# Patient Record
Sex: Male | Born: 1960
Health system: Southern US, Community
[De-identification: ages and names within clinical notes are randomized; demographics above are authoritative.]

## PROBLEM LIST (undated history)

## (undated) DIAGNOSIS — M1990 Unspecified osteoarthritis, unspecified site: Secondary | ICD-10-CM

## (undated) DIAGNOSIS — Z8719 Personal history of other diseases of the digestive system: Secondary | ICD-10-CM

## (undated) DIAGNOSIS — T7840XA Allergy, unspecified, initial encounter: Secondary | ICD-10-CM

## (undated) HISTORY — DX: Unspecified osteoarthritis, unspecified site: M19.90

## (undated) HISTORY — DX: Allergy, unspecified, initial encounter: T78.40XA

## (undated) HISTORY — DX: Personal history of other diseases of the digestive system: Z87.19

---

## 1979-07-20 DIAGNOSIS — Z8719 Personal history of other diseases of the digestive system: Secondary | ICD-10-CM

## 1979-07-20 HISTORY — DX: Personal history of other diseases of the digestive system: Z87.19

## 1979-07-20 HISTORY — PX: ERCP: SHX5425

## 1987-07-20 HISTORY — PX: LUMBAR DISC SURGERY: SHX700

## 2016-01-07 ENCOUNTER — Telehealth: Payer: Self-pay

## 2016-01-07 NOTE — Telephone Encounter (Signed)
Pre vIsit call call completed.

## 2016-01-08 ENCOUNTER — Encounter: Payer: Self-pay | Admitting: Family

## 2016-01-08 ENCOUNTER — Ambulatory Visit (INDEPENDENT_AMBULATORY_CARE_PROVIDER_SITE_OTHER): Payer: 59 | Admitting: Family

## 2016-01-08 ENCOUNTER — Ambulatory Visit (HOSPITAL_BASED_OUTPATIENT_CLINIC_OR_DEPARTMENT_OTHER)
Admission: RE | Admit: 2016-01-08 | Discharge: 2016-01-08 | Disposition: A | Discharge status: Home / Self Care | Payer: 59 | Source: Ambulatory Visit | Attending: Family | Admitting: Family

## 2016-01-08 ENCOUNTER — Other Ambulatory Visit: Payer: Self-pay | Admitting: *Deleted

## 2016-01-08 VITALS — BP 100/72 | HR 68 | Temp 97.7°F | Resp 16 | Ht 66.0 in | Wt 117.2 lb

## 2016-01-08 DIAGNOSIS — M546 Pain in thoracic spine: Secondary | ICD-10-CM | POA: Insufficient documentation

## 2016-01-08 DIAGNOSIS — M47892 Other spondylosis, cervical region: Secondary | ICD-10-CM | POA: Diagnosis not present

## 2016-01-08 DIAGNOSIS — M4184 Other forms of scoliosis, thoracic region: Secondary | ICD-10-CM | POA: Diagnosis not present

## 2016-01-08 DIAGNOSIS — M47894 Other spondylosis, thoracic region: Secondary | ICD-10-CM | POA: Insufficient documentation

## 2016-01-08 DIAGNOSIS — Z720 Tobacco use: Secondary | ICD-10-CM | POA: Insufficient documentation

## 2016-01-08 DIAGNOSIS — M542 Cervicalgia: Secondary | ICD-10-CM

## 2016-01-08 DIAGNOSIS — N529 Male erectile dysfunction, unspecified: Secondary | ICD-10-CM | POA: Diagnosis not present

## 2016-01-08 MED ORDER — MELOXICAM 7.5 MG PO TABS: 7.5000 mg | ORAL_TABLET | Freq: Every day | ORAL | Status: DC

## 2016-01-08 MED ORDER — SILDENAFIL CITRATE 20 MG PO TABS: ORAL_TABLET | ORAL | Status: DC

## 2016-01-08 NOTE — Progress Notes (Signed)
Subjective:    Patient ID: Benjamin Johnson, male    DOB: [birth year], 55 y.o.   MRN: [id number]  HPI  Benjamin Johnson is a 55 yr old male who presents today to establish care.   Pmhx is significant for gallstones (s/p ERCP) 1981.  Still has GB.  Denies any issues since that time.   DDD (s/p lumbar disc surgery 1989 for "bulging disc") and tobacco abuse.    Tobacco abuse- Pt has been smoking for 30 years. Smokes 1 PPD.  The longest he quit for was 4 months.    R shoulder/arm pain- has been present x 1 month.  Started taking ibuprofen 800mg .  Has muscle tightness right neck and upper back.  Reports that pain is not as bad as it was bust  He states that he would like to gain weight.  Current BMI is 18.9.  Denies weight  ED- trouble maintaining erection x 1 year.     Review of Systems See HPI  Past Medical History  Diagnosis Date  . History of gallstones 1981     Social History   Social History  . Marital Status: Married    Spouse Name: N/A  . Number of Children: N/A  . Years of Education: N/A   Occupational History  . Not on file.   Social History Main Topics  . Smoking status: Current Every Day Smoker -- 30 years    Types: Cigarettes  . Smokeless tobacco: Never Used  . Alcohol Use: 3.6 oz/week    6 Cans of beer per week  . Drug Use: No  . Sexual Activity: Not on file   Other Topics Concern  . Not on file   Social History Narrative   Married since age 55   Twin girls (age 57) one is an Financial controllerflight attendant.  Grandson lives with them (age 744). One daughter in [place]   Son (age 57)  He has one son   Enjoys fishing, English as a second language teacherbowling   Works as an Probation officerupholsterer   Completed 12th grade          Past Surgical History  Procedure Laterality Date  . Ercp  1981  . Lumbar disc surgery  1989    Family History  Problem Relation Age of Onset  . Cancer Father 258    colon  . Hyperlipidemia Neg Hx   . Hypertension Neg Hx   . Diabetes Neg Hx     No Known Allergies  No current  outpatient prescriptions on file prior to visit.   No current facility-administered medications on file prior to visit.    BP 100/72 mmHg  Pulse 68  Temp(Src) 97.7 F (36.5 C) (Oral)  Resp 16  Ht 5\' 6"  (1.676 m)  Wt 117 lb 3.2 oz (53.162 kg)  BMI 18.93 kg/m2  SpO2 100%       Objective:   Physical Exam  Constitutional: He is oriented to person, place, and time. He appears well-developed and well-nourished. No distress.  HENT:  Head: Normocephalic and atraumatic.  Cardiovascular: Normal rate and regular rhythm.   No murmur heard. Pulmonary/Chest: Effort normal and breath sounds normal. No respiratory distress. He has no wheezes. He has no rales.  Musculoskeletal: He exhibits no edema.  Neurological: He is alert and oriented to person, place, and time.  Skin: Skin is warm and dry.  Psychiatric: He has a normal mood and affect. His behavior is normal. Thought content normal.          Assessment &  Plan:  Neck/back pain- will obtain x-rays of C-spine/T-spine, suspect some DDD.    Advised pt that his weight is actually in the normal "healthy" range.    40 minutes spent with pt today. >50% of this time was spent counseling patient on smoking cessation, healthy weight, neck/back pain and treatements.

## 2016-01-08 NOTE — Assessment & Plan Note (Signed)
Discussed risks/benefits of nicotine patch, chantix, zyban. He wishes to try nicotine patch. We will also refer him to the smoking cessation classes at [facility].

## 2016-01-08 NOTE — Patient Instructions (Addendum)
To help you quit smoking you may Start nicotine patch use 21 mg for 6 wks, 14 mg for 2 wks, 7 mg for 2 wks. 6-10 cigarettes per day, use 14 mg for 6 wks, 7 mg for 2 wks For ED you can fill Sildenafil (generic viagra) at [facility] [place] [phone] location is: [facility] [address] [place], [place] [phone]  For the neck/back pain- please complete your x rays on the first floor. Welcome to [facility]!

## 2016-01-08 NOTE — Assessment & Plan Note (Signed)
Trial of generic sildenafil 

## 2016-02-23 ENCOUNTER — Telehealth: Payer: Self-pay | Admitting: Family

## 2016-02-23 ENCOUNTER — Encounter: Payer: 59 | Admitting: Family

## 2016-02-23 DIAGNOSIS — Z0289 Encounter for other administrative examinations: Secondary | ICD-10-CM

## 2016-02-23 NOTE — Telephone Encounter (Signed)
pt says that his insurance is stating PCP is not in network. pt want to cancel appt until he knows for sure. Pt will call us back after speaking with his insurance company.   Pt would like to not be charged a No-Show fee

## 2016-02-23 NOTE — Telephone Encounter (Signed)
Noted, do not charge.  

## 2017-02-28 ENCOUNTER — Emergency Department (HOSPITAL_BASED_OUTPATIENT_CLINIC_OR_DEPARTMENT_OTHER)
Admission: EM | Admit: 2017-02-28 | Discharge: 2017-02-28 | Disposition: A | Discharge status: Home / Self Care | Payer: BLUE CROSS/BLUE SHIELD | Attending: Emergency Medicine | Admitting: Emergency Medicine

## 2017-02-28 ENCOUNTER — Encounter (HOSPITAL_BASED_OUTPATIENT_CLINIC_OR_DEPARTMENT_OTHER): Payer: Self-pay | Admitting: *Deleted

## 2017-02-28 ENCOUNTER — Emergency Department (HOSPITAL_BASED_OUTPATIENT_CLINIC_OR_DEPARTMENT_OTHER): Payer: BLUE CROSS/BLUE SHIELD

## 2017-02-28 DIAGNOSIS — Z79899 Other long term (current) drug therapy: Secondary | ICD-10-CM | POA: Diagnosis not present

## 2017-02-28 DIAGNOSIS — R1011 Right upper quadrant pain: Secondary | ICD-10-CM

## 2017-02-28 DIAGNOSIS — R1010 Upper abdominal pain, unspecified: Secondary | ICD-10-CM | POA: Diagnosis present

## 2017-02-28 DIAGNOSIS — F17210 Nicotine dependence, cigarettes, uncomplicated: Secondary | ICD-10-CM | POA: Diagnosis not present

## 2017-02-28 DIAGNOSIS — K2900 Acute gastritis without bleeding: Secondary | ICD-10-CM | POA: Insufficient documentation

## 2017-02-28 LAB — URINALYSIS, MICROSCOPIC (REFLEX): WBC, UA: NONE SEEN WBC/hpf (ref 0–5)

## 2017-02-28 LAB — COMPREHENSIVE METABOLIC PANEL
ALBUMIN: 3.8 g/dL (ref 3.5–5.0)
ALK PHOS: 76 U/L (ref 38–126)
ALT: 23 U/L (ref 17–63)
AST: 29 U/L (ref 15–41)
Anion gap: 9 (ref 5–15)
BUN: 8 mg/dL (ref 6–20)
CALCIUM: 9.4 mg/dL (ref 8.9–10.3)
CHLORIDE: 104 mmol/L (ref 101–111)
CO2: 25 mmol/L (ref 22–32)
CREATININE: 0.95 mg/dL (ref 0.61–1.24)
GFR calc Af Amer: >60 mL/min (ref >60)
GFR calc non Af Amer: >60 mL/min (ref >60)
GLUCOSE: 106 mg/dL — AB (ref 65–99)
Potassium: 3.7 mmol/L (ref 3.5–5.1)
SODIUM: 138 mmol/L (ref 135–145)
Total Bilirubin: 0.6 mg/dL (ref 0.3–1.2)
Total Protein: 7.1 g/dL (ref 6.5–8.1)

## 2017-02-28 LAB — URINALYSIS, ROUTINE W REFLEX MICROSCOPIC
Bilirubin Urine: NEGATIVE
Glucose, UA: NEGATIVE mg/dL
KETONES UR: NEGATIVE mg/dL
Leukocytes, UA: NEGATIVE
NITRITE: NEGATIVE
PROTEIN: NEGATIVE mg/dL
Specific Gravity, Urine: 1.006 (ref 1.005–1.030)
pH: 6 (ref 5.0–8.0)

## 2017-02-28 LAB — CBC
HCT: 41.4 % (ref 39.0–52.0)
HEMOGLOBIN: 14.3 g/dL (ref 13.0–17.0)
MCH: 31.2 pg (ref 26.0–34.0)
MCHC: 34.5 g/dL (ref 30.0–36.0)
MCV: 90.4 fL (ref 78.0–100.0)
PLATELETS: 240 10*3/uL (ref 150–400)
RBC: 4.58 MIL/uL (ref 4.22–5.81)
RDW: 13.3 % (ref 11.5–15.5)
WBC: 7.8 10*3/uL (ref 4.0–10.5)

## 2017-02-28 LAB — LIPASE, BLOOD: LIPASE: 18 U/L (ref 11–51)

## 2017-02-28 MED ORDER — ONDANSETRON HCL 4 MG PO TABS: 4.0000 mg | ORAL_TABLET | Freq: Four times a day (QID) | ORAL | 0 refills | Status: DC

## 2017-02-28 MED ORDER — MORPHINE SULFATE (PF) 4 MG/ML IV SOLN
4.0000 mg | Freq: Once | INTRAVENOUS | Status: AC
  Administered 2017-02-28: 4 mg via INTRAVENOUS
  Filled 2017-02-28: qty 1

## 2017-02-28 MED ORDER — TRAMADOL HCL 50 MG PO TABS: 100.0000 mg | ORAL_TABLET | Freq: Four times a day (QID) | ORAL | 0 refills | Status: DC | PRN

## 2017-02-28 MED ORDER — OMEPRAZOLE 20 MG PO CPDR: 20.0000 mg | DELAYED_RELEASE_CAPSULE | Freq: Every day | ORAL | 0 refills | Status: DC

## 2017-02-28 MED ORDER — IOPAMIDOL (ISOVUE-300) INJECTION 61%
100.0000 mL | Freq: Once | INTRAVENOUS | Status: AC | PRN
  Administered 2017-02-28: 100 mL via INTRAVENOUS

## 2017-02-28 MED ORDER — ONDANSETRON HCL 4 MG/2ML IJ SOLN
4.0000 mg | Freq: Once | INTRAMUSCULAR | Status: AC | PRN
  Administered 2017-02-28: 4 mg via INTRAVENOUS
  Filled 2017-02-28: qty 2

## 2017-02-28 MED ORDER — SODIUM CHLORIDE 0.9 % IV BOLUS (SEPSIS)
1000.0000 mL | Freq: Once | INTRAVENOUS | Status: AC
  Administered 2017-02-28: 1000 mL via INTRAVENOUS

## 2017-02-28 MED ORDER — SODIUM CHLORIDE 0.9 % IV SOLN: 1000.0000 mL | INTRAVENOUS | Status: DC

## 2017-02-28 MED FILL — traMADol HCL 50 MG TABS: 50 | 2 days supply | Qty: 15 | Fill #0

## 2017-02-28 MED FILL — ONDANSETRON HCL 4 MG TABLET: 4 | 3 days supply | Qty: 12 | Fill #0

## 2017-02-28 MED FILL — OMEPRAZOLE DR 20 MG CAPSULE: 20 | 30 days supply | Qty: 30 | Fill #0

## 2017-02-28 NOTE — ED Triage Notes (Signed)
Abdominal pain and lower back pain since yesterday.

## 2017-02-28 NOTE — ED Provider Notes (Signed)
[facility]   CSN: [id number] Arrival date & time: 02/28/17  1256     History   Chief Complaint Chief Complaint  Patient presents with  . Abdominal Pain    HPI Benjamin Johnson is a 56 y.o. male.  HPI Patient has abdominal pain that started fairly abruptly yesterday. He reports he is noticing most in his right upper and central abdomen and he indicates somewhat to the lower as well. Constant aching. He denies he's developed vomiting. He does have nausea. No diarrhea. He reports one bowel movement since onset of pain. He denies history of similar pain. He does report however he once had a gallbladder attack but those stones passed. That was a very long time ago. He reports he drinks about one beer nightly during the week and then a 6 pack on the weekend. Patient does smoke. He is otherwise healthy. Past Medical History:  Diagnosis Date  . History of gallstones 1981    Patient Active Problem List   Diagnosis Date Noted  . Tobacco abuse 01/08/2016  . ED (erectile dysfunction) 01/08/2016    Past Surgical History:  Procedure Laterality Date  . ERCP  1981  . LUMBAR DISC SURGERY  1989       Home Medications    Prior to Admission medications   Medication Sig Start Date End Date Taking? Authorizing Provider  meloxicam (MOBIC) 7.5 MG tablet Take 1 tablet (7.5 mg total) by mouth daily. 01/08/16   Sandford Craze'Sullivan, Melissa, NP  omeprazole (PRILOSEC) 20 MG capsule Take 1 capsule (20 mg total) by mouth daily. 02/28/17   Arby BarrettePfeiffer, Maame Dack, MD  ondansetron (ZOFRAN) 4 MG tablet Take 1 tablet (4 mg total) by mouth every 6 (six) hours. 02/28/17   Arby BarrettePfeiffer, Tavarious Freel, MD  sildenafil (REVATIO) 20 MG tablet Take 1-2 tablets by mouth prior to intercourse 01/08/16   Sandford Craze'Sullivan, Melissa, NP  traMADol (ULTRAM) 50 MG tablet Take 2 tablets (100 mg total) by mouth every 6 (six) hours as needed. 02/28/17   Arby BarrettePfeiffer, Jackalynn Art, MD    Family History Family History  Problem Relation Age of  Onset  . Cancer Father [id number]       colon  . Hyperlipidemia Neg Hx   . Hypertension Neg Hx   . Diabetes Neg Hx     Social History Social History  Substance Use Topics  . Smoking status: Current Every Day Smoker    Years: 30.00    Types: Cigarettes  . Smokeless tobacco: Never Used  . Alcohol use 3.6 oz/week    6 Cans of beer per week     Allergies   Patient has no known allergies.   Review of Systems Review of Systems 10 Systems reviewed and are negative for acute change except as noted in the HPI.   Physical Exam Updated Vital Signs BP 121/77   Pulse 76   Temp 98.6 F (37 C) (Oral)   Resp 20   Ht 5\' 5"  (1.651 m)   Wt 57.2 kg (126 lb)   SpO2 94%   BMI 20.97 kg/m   Physical Exam  Constitutional: He is oriented to person, place, and time. He appears well-developed and well-nourished.  HENT:  Head: Normocephalic and atraumatic.  Eyes: Conjunctivae are normal.  Neck: Neck supple.  Cardiovascular: Normal rate and regular rhythm.   No murmur heard. Pulmonary/Chest: Effort normal and breath sounds normal. No respiratory distress.  Abdominal: Soft. There is tenderness.  Patient was a tenderness in the right upper quadrant and  the right lower quadrant. No guarding.  Musculoskeletal: He exhibits no edema or tenderness.  Neurological: He is alert and oriented to person, place, and time. No cranial nerve deficit. He exhibits normal muscle tone. Coordination normal.  Skin: Skin is warm and dry.  Psychiatric: He has a normal mood and affect.  Nursing note and vitals reviewed.    ED Treatments / Results  Labs (all labs ordered are listed, but only abnormal results are displayed) Labs Reviewed  URINALYSIS, ROUTINE W REFLEX MICROSCOPIC - Abnormal; Notable for the following:       Result Value   Hgb urine dipstick TRACE (*)    All other components within normal limits  COMPREHENSIVE METABOLIC PANEL - Abnormal; Notable for the following:    Glucose, Bld 106 (*)    All  other components within normal limits  URINALYSIS, MICROSCOPIC (REFLEX) - Abnormal; Notable for the following:    Bacteria, UA RARE (*)    Squamous Epithelial / LPF 0-5 (*)    All other components within normal limits  LIPASE, BLOOD  CBC    EKG  EKG Interpretation None       Radiology Ct Abdomen Pelvis W Contrast  Result Date: 02/28/2017 CLINICAL DATA:  Right lower quadrant and right flank pain for 2 days. Low back pain. No known injury. EXAM: CT ABDOMEN AND PELVIS WITH CONTRAST TECHNIQUE: Multidetector CT imaging of the abdomen and pelvis was performed using the standard protocol following bolus administration of intravenous contrast. CONTRAST:  100 ml ISOVUE-300 IOPAMIDOL (ISOVUE-300) INJECTION 61% COMPARISON:  None. FINDINGS: Lower chest: Dependent atelectasis is seen in the lung bases. No pleural or pericardial effusion. Hepatobiliary: No focal liver abnormality is seen. No gallstones, gallbladder wall thickening, or biliary dilatation. Pancreas: Unremarkable. No pancreatic ductal dilatation or surrounding inflammatory changes. Spleen: Normal in size without focal abnormality. Adrenals/Urinary Tract: Right renal cyst is noted. The kidneys otherwise appear normal. Ureters are unremarkable. The urinary bladder is distended. The adrenal glands appear normal. Stomach/Bowel: Stomach is within normal limits. Appendix appears normal. No evidence of bowel wall thickening, distention, or inflammatory changes. Vascular/Lymphatic: No significant vascular findings are present. No enlarged abdominal or pelvic lymph nodes. Reproductive: The prostate gland is mildly enlarged. Other: No fluid collection. Musculoskeletal: Negative. IMPRESSION: No acute abnormality or finding to explain the patient's symptoms. Prostatomegaly. Electronically Signed   By: Drusilla Kanner M.D.   On: 02/28/2017 16:06    Procedures Procedures (including critical care time)  Medications Ordered in ED Medications  sodium  chloride 0.9 % bolus 1,000 mL (0 mLs Intravenous Stopped 02/28/17 1619)    Followed by  0.9 %  sodium chloride infusion (not administered)  ondansetron (ZOFRAN) injection 4 mg (4 mg Intravenous Given 02/28/17 1337)  morphine 4 MG/ML injection 4 mg (4 mg Intravenous Given 02/28/17 1419)  iopamidol (ISOVUE-300) 61 % injection 100 mL (100 mLs Intravenous Contrast Given 02/28/17 1543)     Initial Impression / Assessment and Plan / ED Course  I have reviewed the triage vital signs and the nursing notes.  Pertinent labs & imaging results that were available during my care of the patient were reviewed by me and considered in my medical decision making (see chart for details).      Final Clinical Impressions(s) / ED Diagnoses   Final diagnoses:  Right upper quadrant abdominal pain  Acute gastritis without hemorrhage, unspecified gastritis type   CT does not show acute anomaly. LFTs and lipase within normal limits. Patient does have symptoms of possible  gastritis. He does have regular alcohol use although reportedly not excessive. He also does smoke. Patient is counseled on starting Prilosec and dietary precautions are given for reflux\gastritis. He is advised on a follow-up plan. He reports he will schedule to be seen by a PCP. Return precautions are reviewed. New Prescriptions New Prescriptions   OMEPRAZOLE (PRILOSEC) 20 MG CAPSULE    Take 1 capsule (20 mg total) by mouth daily.   ONDANSETRON (ZOFRAN) 4 MG TABLET    Take 1 tablet (4 mg total) by mouth every 6 (six) hours.   TRAMADOL (ULTRAM) 50 MG TABLET    Take 2 tablets (100 mg total) by mouth every 6 (six) hours as needed.     Arby Barrette, MD 02/28/17 [phone]

## 2018-09-11 ENCOUNTER — Encounter (HOSPITAL_BASED_OUTPATIENT_CLINIC_OR_DEPARTMENT_OTHER): Payer: Self-pay | Admitting: *Deleted

## 2018-09-11 ENCOUNTER — Other Ambulatory Visit: Payer: Self-pay

## 2018-09-11 ENCOUNTER — Emergency Department (HOSPITAL_BASED_OUTPATIENT_CLINIC_OR_DEPARTMENT_OTHER)
Admission: EM | Admit: 2018-09-11 | Discharge: 2018-09-11 | Disposition: A | Discharge status: Home / Self Care | Payer: Self-pay | Attending: Emergency Medicine | Admitting: Emergency Medicine

## 2018-09-11 ENCOUNTER — Emergency Department (HOSPITAL_BASED_OUTPATIENT_CLINIC_OR_DEPARTMENT_OTHER): Payer: Self-pay

## 2018-09-11 DIAGNOSIS — R059 Cough, unspecified: Secondary | ICD-10-CM

## 2018-09-11 DIAGNOSIS — F17210 Nicotine dependence, cigarettes, uncomplicated: Secondary | ICD-10-CM | POA: Insufficient documentation

## 2018-09-11 DIAGNOSIS — J111 Influenza due to unidentified influenza virus with other respiratory manifestations: Secondary | ICD-10-CM | POA: Insufficient documentation

## 2018-09-11 DIAGNOSIS — R05 Cough: Secondary | ICD-10-CM

## 2018-09-11 MED ORDER — GUAIFENESIN-CODEINE 100-10 MG/5ML PO SOLN
5.0000 mL | Freq: Once | ORAL | Status: DC
  Filled 2018-09-11 (×2): qty 5

## 2018-09-11 MED ORDER — IBUPROFEN 800 MG PO TABS
800.0000 mg | ORAL_TABLET | Freq: Once | ORAL | Status: AC
  Administered 2018-09-11: 800 mg via ORAL
  Filled 2018-09-11: qty 1

## 2018-09-11 MED ORDER — PSEUDOEPH-BROMPHEN-DM 30-2-10 MG/5ML PO SYRP: 5.0000 mL | ORAL_SOLUTION | Freq: Four times a day (QID) | ORAL | 0 refills | Status: DC | PRN

## 2018-09-11 NOTE — ED Notes (Signed)
ED Provider at bedside. 

## 2018-09-11 NOTE — ED Triage Notes (Addendum)
Cough and fever since last night. States he feels like he has the flu. He took Ibuprofen 5 hours ago.

## 2018-09-11 NOTE — ED Provider Notes (Signed)
[facility] EMERGENCY DEPARTMENT Provider Note   CSN: [id number] Arrival date & time: 09/11/18  1750    History   Chief Complaint Chief Complaint  Patient presents with  . Cough    HPI Benjamin Johnson is a 58 y.o. male.     Patient is a 74 year old African-American male, smoker who presents emergency department for flulike symptoms since last night.  Reports cough, fever, body aches, nasal congestion since last night.  He did take some antipyretic about 5 hours ago.  Reports 1 episode of nonbloody vomiting.  Denies any abdominal pain, chest pain, shortness of breath.  Reports sick contacts.     Past Medical History:  Diagnosis Date  . History of gallstones 1981    Patient Active Problem List   Diagnosis Date Noted  . Tobacco abuse 01/08/2016  . ED (erectile dysfunction) 01/08/2016    Past Surgical History:  Procedure Laterality Date  . ERCP  1981  . LUMBAR DISC SURGERY  1989        Home Medications    Prior to Admission medications   Medication Sig Start Date End Date Taking? Authorizing Provider  brompheniramine-pseudoephedrine-DM 30-2-10 MG/5ML syrup Take 5 mLs by mouth 4 (four) times daily as needed. 09/11/18   Arlyn Dunning, PA-C  meloxicam (MOBIC) 7.5 MG tablet Take 1 tablet (7.5 mg total) by mouth daily. 01/08/16   Sandford Craze, NP  omeprazole (PRILOSEC) 20 MG capsule Take 1 capsule (20 mg total) by mouth daily. 02/28/17   Tegeler, Canary Brim, MD  ondansetron (ZOFRAN) 4 MG tablet Take 1 tablet (4 mg total) by mouth every 6 (six) hours. 02/28/17   Tegeler, Canary Brim, MD  sildenafil (REVATIO) 20 MG tablet Take 1-2 tablets by mouth prior to intercourse 01/08/16   Sandford Craze, NP  traMADol (ULTRAM) 50 MG tablet Take 2 tablets (100 mg total) by mouth every 6 (six) hours as needed. 02/28/17   Tegeler, Canary Brim, MD    Family History Family History  Problem Relation Age of Onset  . Cancer Father 28       colon  .  Hyperlipidemia Neg Hx   . Hypertension Neg Hx   . Diabetes Neg Hx     Social History Social History   Tobacco Use  . Smoking status: Current Every Day Smoker    Years: 30.00    Types: Cigarettes  . Smokeless tobacco: Never Used  Substance Use Topics  . Alcohol use: Yes    Alcohol/week: 6.0 standard drinks    Types: 6 Cans of beer per week  . Drug use: No     Allergies   Patient has no known allergies.   Review of Systems Review of Systems  Constitutional: Negative for chills and fever.  HENT: Positive for congestion. Negative for ear pain, sinus pressure, sinus pain and sore throat.   Eyes: Negative for pain and visual disturbance.  Respiratory: Positive for cough. Negative for shortness of breath.   Cardiovascular: Negative for chest pain and palpitations.  Gastrointestinal: Positive for nausea and vomiting. Negative for abdominal pain and diarrhea.  Genitourinary: Negative for dysuria and hematuria.  Musculoskeletal: Positive for myalgias. Negative for arthralgias and back pain.  Skin: Negative for color change and rash.  Allergic/Immunologic: Negative for immunocompromised state.  Neurological: Negative for dizziness, seizures and syncope.  All other systems reviewed and are negative.    Physical Exam Updated Vital Signs BP 107/60   Pulse (!) 108   Temp (!) 100.8 F (38.2 C) (  Oral)   Resp 20   Ht  (1.676 m)   Wt 61.7 kg   SpO2 96%   BMI 21.95 kg/m   Physical Exam Vitals signs and nursing note reviewed.  Constitutional:      Appearance: He is well-developed.  HENT:     Head: Normocephalic and atraumatic.     Right Ear: Tympanic membrane normal.     Left Ear: Tympanic membrane normal.     Nose: Congestion present. No rhinorrhea.     Mouth/Throat:     Mouth: Mucous membranes are moist.     Pharynx: Oropharynx is clear. No oropharyngeal exudate or posterior oropharyngeal erythema.  Eyes:     Conjunctiva/sclera: Conjunctivae normal.  Neck:      Musculoskeletal: Neck supple.  Cardiovascular:     Rate and Rhythm: Normal rate and regular rhythm.     Heart sounds: No murmur.  Pulmonary:     Effort: Pulmonary effort is normal. No respiratory distress.     Breath sounds: Normal breath sounds.  Abdominal:     Palpations: Abdomen is soft.     Tenderness: There is no abdominal tenderness.  Skin:    General: Skin is warm and dry.     Capillary Refill: Capillary refill takes less than 2 seconds.  Neurological:     General: No focal deficit present.     Mental Status: He is alert.  Psychiatric:        Mood and Affect: Mood normal.      ED Treatments / Results  Labs (all labs ordered are listed, but only abnormal results are displayed) Labs Reviewed - No data to display  EKG None  Radiology Dg Chest 2 View  Result Date: 09/11/2018 CLINICAL DATA:  Cough and fever EXAM: CHEST - 2 VIEW COMPARISON:  01/08/2016 FINDINGS: The heart size and mediastinal contours are within normal limits. Both lungs are clear. The visualized skeletal structures are unremarkable. IMPRESSION: No active cardiopulmonary disease. Electronically Signed   By: Judie Petit.  Shick M.D.   On: 09/11/2018 18:44    Procedures Procedures (including critical care time)  Medications Ordered in ED Medications  guaiFENesin-codeine 100-10 MG/5ML solution 5 mL (5 mLs Oral Not Given 09/11/18 1839)  ibuprofen (ADVIL,MOTRIN) tablet 800 mg (800 mg Oral Given 09/11/18 1824)     Initial Impression / Assessment and Plan / ED Course  I have reviewed the triage vital signs and the nursing notes.  Pertinent labs & imaging results that were available during my care of the patient were reviewed by me and considered in my medical decision making (see chart for details).        Based on review of vitals, medical screening exam, lab work and/or imaging, there does not appear to be an acute, emergent etiology for the patient's symptoms. Counseled pt on good return precautions and  encouraged both PCP and ED follow-up as needed.  Prior to discharge, I also discussed incidental imaging findings with patient in detail and advised appropriate, recommended follow-up in detail.  Clinical Impression: 1. Influenza   2. Cough     Disposition: Discharge    This note was prepared with assistance of Dragon voice recognition software. Occasional wrong-word or sound-a-like substitutions may have occurred due to the inherent limitations of voice recognition software.   Final Clinical Impressions(s) / ED Diagnoses   Final diagnoses:  Influenza  Cough    ED Discharge Orders         Ordered    brompheniramine-pseudoephedrine-DM 30-2-10 MG/5ML  syrup  4 times daily PRN     09/11/18 1854           Jeral Pinch 09/11/18 1856    Maia Plan, MD 09/12/18 1018

## 2018-09-11 NOTE — ED Notes (Signed)
Patient transported to X-ray 

## 2018-09-11 NOTE — Discharge Instructions (Signed)
Thank you for allowing me to care for you today. Please return to the emergency department if you have new or worsening symptoms. Take your medications as instructed.  ° °

## 2019-11-06 ENCOUNTER — Emergency Department (HOSPITAL_BASED_OUTPATIENT_CLINIC_OR_DEPARTMENT_OTHER): Payer: BLUE CROSS/BLUE SHIELD

## 2019-11-06 ENCOUNTER — Other Ambulatory Visit: Payer: Self-pay

## 2019-11-06 ENCOUNTER — Emergency Department (HOSPITAL_BASED_OUTPATIENT_CLINIC_OR_DEPARTMENT_OTHER)
Admission: EM | Admit: 2019-11-06 | Discharge: 2019-11-06 | Disposition: A | Discharge status: Home / Self Care | Payer: Worker's Compensation | Attending: Emergency Medicine | Admitting: Emergency Medicine

## 2019-11-06 ENCOUNTER — Encounter (HOSPITAL_BASED_OUTPATIENT_CLINIC_OR_DEPARTMENT_OTHER): Payer: Self-pay | Admitting: Oncology

## 2019-11-06 DIAGNOSIS — M25562 Pain in left knee: Secondary | ICD-10-CM | POA: Diagnosis present

## 2019-11-06 DIAGNOSIS — Z79899 Other long term (current) drug therapy: Secondary | ICD-10-CM | POA: Insufficient documentation

## 2019-11-06 DIAGNOSIS — F17210 Nicotine dependence, cigarettes, uncomplicated: Secondary | ICD-10-CM | POA: Insufficient documentation

## 2019-11-06 MED ORDER — NAPROXEN 500 MG PO TABS: 500.0000 mg | ORAL_TABLET | Freq: Two times a day (BID) | ORAL | 0 refills | Status: DC

## 2019-11-06 MED ORDER — HYDROCODONE-ACETAMINOPHEN 5-325 MG PO TABS
1.0000 | ORAL_TABLET | Freq: Once | ORAL | Status: AC
Start: 2019-11-06 — End: 2019-11-06
  Administered 2019-11-06: 1 via ORAL
  Filled 2019-11-06: qty 1

## 2019-11-06 NOTE — ED Notes (Signed)
UDS completed. Unable to complete a BAT due to having no one certified on it.

## 2019-11-06 NOTE — ED Triage Notes (Signed)
Pt reports slipping and falling at work onto his left knee.  States he needed assistance to get up.  Pt is also c/o lower back pain.  Pt reported that it felt like his left, "Twisted" Pt also heard a popping sound.

## 2019-11-06 NOTE — ED Notes (Signed)
ED Provider at bedside. 

## 2019-11-06 NOTE — ED Provider Notes (Addendum)
[facility] EMERGENCY DEPARTMENT Provider Note   CSN: [id number] Arrival date & time: 11/06/19  4097     History Chief Complaint  Patient presents with  . Knee Pain    Benjamin Johnson is a 59 y.o. male.  HPI     This is a 59 year old male with no reported significant past medical history who presents with left knee pain.  Patient reports that he was pushing carts at work when he fell.  His knee twisted and he felt a pop and that he fell to the floor.  He did not hit his head or lose consciousness.  He was given ibuprofen at work.  He rates his pain a 10 out of 10.  Pain is worse with range of motion and bearing weight.  Denies numbness or tingling of the leg.  Past Medical History:  Diagnosis Date  . History of gallstones 1981    Patient Active Problem List   Diagnosis Date Noted  . Tobacco abuse 01/08/2016  . ED (erectile dysfunction) 01/08/2016    Past Surgical History:  Procedure Laterality Date  . ERCP  1981  . LUMBAR DISC SURGERY  1989       Family History  Problem Relation Age of Onset  . Cancer Father 44       colon  . Hyperlipidemia Neg Hx   . Hypertension Neg Hx   . Diabetes Neg Hx     Social History   Tobacco Use  . Smoking status: Current Every Day Smoker    Years: 30.00    Types: Cigarettes  . Smokeless tobacco: Never Used  Substance Use Topics  . Alcohol use: Yes    Alcohol/week: 6.0 standard drinks    Types: 6 Cans of beer per week  . Drug use: No    Home Medications Prior to Admission medications   Medication Sig Start Date End Date Taking? Authorizing Provider  brompheniramine-pseudoephedrine-DM 30-2-10 MG/5ML syrup Take 5 mLs by mouth 4 (four) times daily as needed. 09/11/18   Arlyn Dunning, PA-C  meloxicam (MOBIC) 7.5 MG tablet Take 1 tablet (7.5 mg total) by mouth daily. 01/08/16   Sandford Craze, NP  naproxen (NAPROSYN) 500 MG tablet Take 1 tablet (500 mg total) by mouth 2 (two) times daily. 11/06/19   Rudell Ortman,  Mayer Masker, MD  omeprazole (PRILOSEC) 20 MG capsule Take 1 capsule (20 mg total) by mouth daily. 02/28/17   Tegeler, Canary Brim, MD  ondansetron (ZOFRAN) 4 MG tablet Take 1 tablet (4 mg total) by mouth every 6 (six) hours. 02/28/17   Tegeler, Canary Brim, MD  sildenafil (REVATIO) 20 MG tablet Take 1-2 tablets by mouth prior to intercourse 01/08/16   Sandford Craze, NP  traMADol (ULTRAM) 50 MG tablet Take 2 tablets (100 mg total) by mouth every 6 (six) hours as needed. 02/28/17   Tegeler, Canary Brim, MD    Allergies    Patient has no known allergies.  Review of Systems   Review of Systems  Constitutional: Negative for fever.  Musculoskeletal:       Left knee pain  Neurological: Negative for weakness and numbness.  All other systems reviewed and are negative.   Physical Exam Updated Vital Signs BP 129/84 (BP Location: Right Arm)   Pulse 63   Temp 97.8 F (36.6 C) (Oral)   Resp 16   Ht 1.664 m (5' 5.5")   Wt 58.5 kg   SpO2 100%   BMI 21.14 kg/m   Physical Exam Vitals and  nursing note reviewed.  Constitutional:      Appearance: He is well-developed. He is not ill-appearing.  HENT:     Head: Normocephalic and atraumatic.     Nose: Nose normal.     Mouth/Throat:     Mouth: Mucous membranes are moist.  Eyes:     Pupils: Pupils are equal, round, and reactive to light.  Cardiovascular:     Rate and Rhythm: Normal rate and regular rhythm.  Pulmonary:     Effort: Pulmonary effort is normal. No respiratory distress.  Musculoskeletal:     Cervical back: Neck supple.     Comments: Tenderness palpation of the medial aspect of the knee without obvious effusion or deformity, there are no overlying skin changes, limited range of motion secondary to pain, no joint laxity noted, patient able to fire her quad, 2+ DP pulse, neurovascular intact distally  Lymphadenopathy:     Cervical: No cervical adenopathy.  Skin:    General: Skin is warm and dry.  Neurological:     Mental  Status: He is alert and oriented to person, place, and time.  Psychiatric:        Mood and Affect: Mood normal.     ED Results / Procedures / Treatments   Labs (all labs ordered are listed, but only abnormal results are displayed) Labs Reviewed - No data to display  EKG None  Radiology DG Knee Complete 4 Views Left  Result Date: 11/06/2019 CLINICAL DATA:  Left knee pain. EXAM: LEFT KNEE - COMPLETE 4+ VIEW COMPARISON:  No prior. FINDINGS: No acute bony or joint abnormality identified. No evidence of fracture or dislocation. Tiny loose body in the femoral tibial joint space cannot be excluded. Mild peripheral vascular calcification cannot be exclude. IMPRESSION: 1.  No acute bony abnormality identified. 2. Tiny loose body in the femoral tibial joint space cannot be excluded. 3. Mild peripheral vascular calcification cannot be excluded. This suggest peripheral vascular disease. Electronically Signed   By: Maisie Fus  Register   On: 11/06/2019 07:14    Procedures Procedures (including critical care time)  Medications Ordered in ED Medications  HYDROcodone-acetaminophen (NORCO/VICODIN) 5-325 MG per tablet 1 tablet (1 tablet Oral Given 11/06/19 0735)    ED Course  I have reviewed the triage vital signs and the nursing notes.  Pertinent labs & imaging results that were available during my care of the patient were reviewed by me and considered in my medical decision making (see chart for details).    MDM Rules/Calculators/A&P                       Presents with isolated left knee pain after fall at work.  He is overall nontoxic and vital signs reassuring.  He has tenderness over the medial aspect of the knee.  No obvious deformities.  Suspect ligamentous or meniscus injury.  X-rays obtained to rule out fracture.  He is pain was refractory to ibuprofen.  Have ordered Norco but he requires a urine drug screen.  Requested nursing obtain this prior to administering Norco.  Also was given  ice.  Xrays neg.  WBAT, ice, NSAIDS.  Referred to sports medicine.  After history, exam, and medical workup I feel the patient has been appropriately medically screened and is safe for discharge home. Pertinent diagnoses were discussed with the patient. Patient was given return precautions.   Final Clinical Impression(s) / ED Diagnoses Final diagnoses:  Acute pain of left knee    Rx / DC  Orders ED Discharge Orders         Ordered    naproxen (NAPROSYN) 500 MG tablet  2 times daily     11/06/19 0724           Talor Cheema, Barbette Hair, MD 11/06/19 2310    Merryl Hacker, MD 11/06/19 2312

## 2019-11-06 NOTE — Discharge Instructions (Signed)
You were seen today for knee pain.  Your x-rays were negative for fracture.  Ice and elevate the knee.  You may have a ligamentous or meniscus injury.  Follow-up with sports medicine if you continue to have pain or swelling.

## 2019-11-07 ENCOUNTER — Encounter: Payer: Self-pay | Admitting: Family Medicine

## 2019-11-07 ENCOUNTER — Ambulatory Visit (INDEPENDENT_AMBULATORY_CARE_PROVIDER_SITE_OTHER): Payer: Worker's Compensation | Admitting: Family Medicine

## 2019-11-07 ENCOUNTER — Ambulatory Visit: Payer: Self-pay

## 2019-11-07 VITALS — BP 132/84 | HR 88 | Ht 65.0 in | Wt 130.0 lb

## 2019-11-07 DIAGNOSIS — M25562 Pain in left knee: Secondary | ICD-10-CM

## 2019-11-07 NOTE — Progress Notes (Signed)
Benjamin Johnson - 59 y.o. male MRN [id number]  Date of birth: 1961/05/31  SUBJECTIVE:  Including CC & ROS.  Chief Complaint  Patient presents with  . Knee Injury    left knee x 11/06/2019    Benjamin Johnson is a 59 y.o. male that is presenting with acute left knee pain. He was at work and sustained an injury. He had a fall is unsure as to exact mechanism. He feels like his leg was hit on the ground and felt a pop for that. Since that time he is seen in the emergency room and having pain with ambulation. He has been using crutches. Denies any numbness or tingling. Does feel some pain down the anterior shin.  Independent review of the left knee x-ray from 4/20 shows no significant degenerative changes.   Review of Systems See HPI   HISTORY: Past Medical, Surgical, Social, and Family History Reviewed & Updated per EMR.   Pertinent Historical Findings include:  Past Medical History:  Diagnosis Date  . History of gallstones 1981    Past Surgical History:  Procedure Laterality Date  . ERCP  1981  . LUMBAR DISC SURGERY  1989    Family History  Problem Relation Age of Onset  . Cancer Father 31       colon  . Hyperlipidemia Neg Hx   . Hypertension Neg Hx   . Diabetes Neg Hx     Social History   Socioeconomic History  . Marital status: Married    Spouse name: Not on file  . Number of children: Not on file  . Years of education: Not on file  . Highest education level: Not on file  Occupational History  . Not on file  Tobacco Use  . Smoking status: Current Every Day Smoker    Years: 30.00    Types: Cigarettes  . Smokeless tobacco: Never Used  Substance and Sexual Activity  . Alcohol use: Yes    Alcohol/week: 6.0 standard drinks    Types: 6 Cans of beer per week  . Drug use: No  . Sexual activity: Not on file  Other Topics Concern  . Not on file  Social History Narrative   Married since age 45   Twin girls (age 60) one is an Financial controller.  Grandson lives with  them (age 3). One daughter in [place]   Son (age 35)  He has one son   Enjoys fishing, English as a second language teacher   Works as an Probation officer   Completed 12th grade      Social Determinants of Corporate investment banker Strain:   . Difficulty of Paying Living Expenses:   Food Insecurity:   . Worried About Programme researcher, broadcasting/film/video in the Last Year:   . Barista in the Last Year:   Transportation Needs:   . Freight forwarder (Medical):   Marland Kitchen Lack of Transportation (Non-Medical):   Physical Activity:   . Days of Exercise per Week:   . Minutes of Exercise per Session:   Stress:   . Feeling of Stress :   Social Connections:   . Frequency of Communication with Friends and Family:   . Frequency of Social Gatherings with Friends and Family:   . Attends Religious Services:   . Active Member of Clubs or Organizations:   . Attends Banker Meetings:   Marland Kitchen Marital Status:   Intimate Partner Violence:   . Fear of Current or Ex-Partner:   . Emotionally Abused:   .  Physically Abused:   . Sexually Abused:      PHYSICAL EXAM:  VS: BP 132/84   Pulse 88   Ht 5\' 5"  (1.651 m)   Wt 130 lb (59 kg)   BMI 21.63 kg/m  Physical Exam Gen: NAD, alert, cooperative with exam, well-appearing MSK:  Left knee No effusion. Normal range of motion. Normal strength resistance. No instability with valgus or varus stress testing. Tenderness palpation of the medial joint line. Negative McMurray's test. Neurovascular intact  Limited ultrasound: Left knee:  No effusion in the suprapatellar pouch. Normal-appearing quadricep and patellar tendon. No changes observed over the medial meniscus and joint line. No hyperemia observed over the MCL or medial retinaculum. No changes over the medial proximal tibial plateau. Normal-appearing lateral joint space  Summary: No acute structural changes observed on ultrasound.  Ultrasound and interpretation by Clearance Coots, MD    ASSESSMENT & PLAN:   Acute pain  of left knee Injury occurred at work on 4/20. No changes observed on x-ray or ultrasound. May have a bony contusion with the pain he is experiencing. -Counseled on home exercise therapy and supportive care. -Counseled on return to weightbearing. -Provided work note. -If no improvement can consider imaging or physical therapy or injection.

## 2019-11-07 NOTE — Patient Instructions (Signed)
Nice to meet you Please try ice  Please slowly start putting weight on the knee/leg.  Please try the exercises  Please send me a message in MyChart with any questions or updates.  Please see me back in 2 weeks.   --Dr. Jordan Likes

## 2019-11-07 NOTE — Assessment & Plan Note (Signed)
Injury occurred at work on 4/20. No changes observed on x-ray or ultrasound. May have a bony contusion with the pain he is experiencing. -Counseled on home exercise therapy and supportive care. -Counseled on return to weightbearing. -Provided work note. -If no improvement can consider imaging or physical therapy or injection.

## 2019-11-20 NOTE — Progress Notes (Addendum)
Benjamin Johnson - 59 y.o. male MRN [id number]  Date of birth: [birth year]  SUBJECTIVE:  Including CC & ROS.  Chief Complaint  Patient presents with  . Follow-up    left knee    Benjamin Johnson is a 59 y.o. male that is following up for his left knee pain. His injury occurred at work. He is able to put more weight on his knee. He is still having pain over the medial aspect of the knee. Also having pain radiating down his anterior medial aspect of his tibia. The knee doesn't feel like normal.    Review of Systems See HPI   HISTORY: Past Medical, Surgical, Social, and Family History Reviewed & Updated per EMR.   Pertinent Historical Findings include:  Past Medical History:  Diagnosis Date  . History of gallstones 1981    Past Surgical History:  Procedure Laterality Date  . ERCP  1981  . LUMBAR DISC SURGERY  1989    Family History  Problem Relation Age of Onset  . Cancer Father 70       colon  . Hyperlipidemia Neg Hx   . Hypertension Neg Hx   . Diabetes Neg Hx     Social History   Socioeconomic History  . Marital status: Married    Spouse name: Not on file  . Number of children: Not on file  . Years of education: Not on file  . Highest education level: Not on file  Occupational History  . Not on file  Tobacco Use  . Smoking status: Current Every Day Smoker    Years: 30.00    Types: Cigarettes  . Smokeless tobacco: Never Used  Substance and Sexual Activity  . Alcohol use: Yes    Alcohol/week: 6.0 standard drinks    Types: 6 Cans of beer per week  . Drug use: No  . Sexual activity: Not on file  Other Topics Concern  . Not on file  Social History Narrative   Married since age 13   Twin girls (age 73) one is an Catering manager.  Grandson lives with them (age 52). One daughter in [place]   Son (age 73)  He has one son   Enjoys fishing, Environmental consultant   Works as an Animal nutritionist   Completed 12th grade      Social Determinants of Radio broadcast assistant Strain:   .  Difficulty of Paying Living Expenses:   Food Insecurity:   . Worried About Charity fundraiser in the Last Year:   . Arboriculturist in the Last Year:   Transportation Needs:   . Film/video editor (Medical):   Marland Kitchen Lack of Transportation (Non-Medical):   Physical Activity:   . Days of Exercise per Week:   . Minutes of Exercise per Session:   Stress:   . Feeling of Stress :   Social Connections:   . Frequency of Communication with Friends and Family:   . Frequency of Social Gatherings with Friends and Family:   . Attends Religious Services:   . Active Member of Clubs or Organizations:   . Attends Archivist Meetings:   Marland Kitchen Marital Status:   Intimate Partner Violence:   . Fear of Current or Ex-Partner:   . Emotionally Abused:   Marland Kitchen Physically Abused:   . Sexually Abused:      PHYSICAL EXAM:  VS: BP 114/78   Pulse 76   Ht 5\' 5"  (1.651 m)   Wt 130 lb (59 kg)  BMI 21.63 kg/m  Physical Exam Gen: NAD, alert, cooperative with exam, well-appearing MSK:  Left knee:  No effusion  Normal ROM  No TTp of the medial joint line  No swelling distally  NVI       ASSESSMENT & PLAN:   Acute pain of left knee Pain has improved since his injury at work.  - counseled on HEP and supportive care - voltaren.  - referral to physical therapy  - provided work note  - f/u in 4-6 weeks. Can try injection or consider MRI.   Addendum Switch to voltaren 1.5% as it is on his formulary.

## 2019-11-21 ENCOUNTER — Ambulatory Visit (INDEPENDENT_AMBULATORY_CARE_PROVIDER_SITE_OTHER): Payer: Worker's Compensation | Admitting: Family Medicine

## 2019-11-21 ENCOUNTER — Other Ambulatory Visit: Payer: Self-pay

## 2019-11-21 ENCOUNTER — Encounter: Payer: Self-pay | Admitting: Family Medicine

## 2019-11-21 VITALS — BP 114/78 | HR 76 | Ht 65.0 in | Wt 130.0 lb

## 2019-11-21 DIAGNOSIS — M25562 Pain in left knee: Secondary | ICD-10-CM

## 2019-11-21 MED ORDER — DICLOFENAC SODIUM 1 % EX GEL: 4.0000 g | Freq: Four times a day (QID) | CUTANEOUS | 11 refills | Status: DC

## 2019-11-21 NOTE — Patient Instructions (Signed)
Good to see you Please try the volatren  Physical therapy will give you a call  Please try ice as needed   Please send me a message in MyChart with any questions or updates.  Please see me back in 4-6 weeks.   --Dr. Jordan Likes

## 2019-11-21 NOTE — Assessment & Plan Note (Addendum)
Pain has improved since his injury at work.  - counseled on HEP and supportive care - voltaren.  - referral to physical therapy  - provided work note  - f/u in 4-6 weeks. Can try injection or consider MRI.   Addendum Switch to voltaren 1.5% as it is on his formulary.

## 2019-11-22 ENCOUNTER — Telehealth: Payer: Self-pay | Admitting: Family Medicine

## 2019-11-22 NOTE — Telephone Encounter (Signed)
Patient requesting a call back to discuss work accommodations

## 2019-11-22 NOTE — Telephone Encounter (Signed)
Spoke with patient about his questions.   Myra Rude, MD [facility] 11/22/2019, 3:04 PM

## 2019-11-23 MED ORDER — DICLOFENAC SODIUM 1.5 % EX SOLN: 40.0000 [drp] | Freq: Four times a day (QID) | CUTANEOUS | 3 refills | Status: DC | PRN

## 2019-11-23 NOTE — Addendum Note (Signed)
Addended by: Myra Rude on: 11/23/2019 09:01 AM   Modules accepted: Orders

## 2020-01-02 ENCOUNTER — Other Ambulatory Visit: Payer: Self-pay

## 2020-01-02 ENCOUNTER — Ambulatory Visit (INDEPENDENT_AMBULATORY_CARE_PROVIDER_SITE_OTHER): Payer: Worker's Compensation | Admitting: Family Medicine

## 2020-01-02 ENCOUNTER — Encounter: Payer: Self-pay | Admitting: Family Medicine

## 2020-01-02 VITALS — BP 121/83 | HR 93 | Ht 66.0 in | Wt 130.0 lb

## 2020-01-02 DIAGNOSIS — M25562 Pain in left knee: Secondary | ICD-10-CM | POA: Diagnosis not present

## 2020-01-02 NOTE — Progress Notes (Signed)
Benjamin Johnson - 59 y.o. male MRN [id number]  Date of birth: 18-Aug-1960  SUBJECTIVE:  Including CC & ROS.  Chief Complaint  Patient presents with  . Follow-up    left knee    Benjamin Johnson is a 59 y.o. male that is following up for his left knee pain.  This pain is ongoing and seems to be worse after physical therapy.  He still feels it over the medial aspect of the joint with radiation down the medial aspect of the leg.  Denies any mechanical symptoms.   Review of Systems See HPI   HISTORY: Past Medical, Surgical, Social, and Family History Reviewed & Updated per EMR.   Pertinent Historical Findings include:  Past Medical History:  Diagnosis Date  . History of gallstones 1981    Past Surgical History:  Procedure Laterality Date  . ERCP  1981  . LUMBAR DISC SURGERY  1989    Family History  Problem Relation Age of Onset  . Cancer Father 73       colon  . Hyperlipidemia Neg Hx   . Hypertension Neg Hx   . Diabetes Neg Hx     Social History   Socioeconomic History  . Marital status: Married    Spouse name: Not on file  . Number of children: Not on file  . Years of education: Not on file  . Highest education level: Not on file  Occupational History  . Not on file  Tobacco Use  . Smoking status: Current Every Day Smoker    Years: 30.00    Types: Cigarettes  . Smokeless tobacco: Never Used  Substance and Sexual Activity  . Alcohol use: Yes    Alcohol/week: 6.0 standard drinks    Types: 6 Cans of beer per week  . Drug use: No  . Sexual activity: Not on file  Other Topics Concern  . Not on file  Social History Narrative   Married since age 80   Twin girls (age 65) one is an Catering manager.  Grandson lives with them (age 61). 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 Difficulty of Paying Living  Expenses:   Food Insecurity:   . Worried About Charity fundraiser in the Last Year:   . Arboriculturist in the Last Year:   Transportation Needs:   . Film/video editor (Medical):   Marland Kitchen Lack of Transportation (Non-Medical):   Physical Activity:   . Days of Exercise per Week:   . Minutes of Exercise per Session:   Stress:   . Feeling of Stress :   Social Connections:   . Frequency of Communication with Friends and Family:   . Frequency of Social Gatherings with Friends and Family:   . Attends Religious Services:   . Active Member of Clubs or Organizations:   . Attends Archivist Meetings:   Marland Kitchen Marital Status:   Intimate Partner Violence:   . Fear of Current or Ex-Partner:   . Emotionally Abused:   Marland Kitchen Physically Abused:   . Sexually Abused:      PHYSICAL EXAM:  VS: BP 121/83   Pulse 93   Ht 5\' 6"  (1.676 m)   Wt 130 lb (59 kg)   BMI 20.98 kg/m  Physical  Exam Gen: NAD, alert, cooperative with exam, well-appearing MSK:  Left knee: No obvious effusion. Tenderness to palpation of the medial joint space and medial lower leg. Normal range of motion. Normal strength resistance. Neurovascular intact     ASSESSMENT & PLAN:   Acute pain of left knee Pain is ongoing stemming from his injury at work.  He has not had significant improvement given the time or with physical therapy.  It is still ongoing.  Concern for possible meniscus -Counseled on home exercise therapy and supportive care. -Provided work note. -MRI to evaluate for any meniscus pathology.

## 2020-01-02 NOTE — Assessment & Plan Note (Signed)
Pain is ongoing stemming from his injury at work.  He has not had significant improvement given the time or with physical therapy.  It is still ongoing.  Concern for possible meniscus -Counseled on home exercise therapy and supportive care. -Provided work note. -MRI to evaluate for any meniscus pathology.

## 2020-01-02 NOTE — Patient Instructions (Signed)
Good to see you We'll put physical therapy on hold  Please continue ice as needed  Please send me a message in MyChart with any questions or updates.  Please see me back in 4 weeks.   --Dr. Jordan Likes

## 2020-01-29 ENCOUNTER — Telehealth: Payer: Self-pay | Admitting: Family Medicine

## 2020-01-29 NOTE — Telephone Encounter (Signed)
Updated note provided.   Myra Rude, MD [facility] 01/29/2020, 1:10 PM

## 2020-01-29 NOTE — Telephone Encounter (Signed)
Fayrene Fearing called from [facility] P) [phone]) /  -[phone]). Patient's MRI has been scheduled for :   Tuesday, July 20 @ 8:15am   W/ [facility] [address] [place], [place] [address]  P) [phone] F) [phone]  fyi  --glh

## 2020-01-29 NOTE — Telephone Encounter (Signed)
Pt's nurse case mgr/ Jorje Guild called states he has NOT had the MRI yet, so  7/14 appt needs to be rescheduled to 02/27/20 (this has been rescheduled.)  --- Per her patient will also need an updated work note that will cover him till he comes to see Dr. Jordan Likes on 8/11.  --Please contact Jorje Guild @ [facility] @ [phone] if there are any questions/ concerns.  --Please fax updated work note to her @ [phone]  --glh

## 2020-01-30 ENCOUNTER — Encounter: Payer: BC Managed Care – PPO | Admitting: Family Medicine

## 2020-01-30 ENCOUNTER — Telehealth: Payer: Self-pay | Admitting: Family Medicine

## 2020-01-30 NOTE — Telephone Encounter (Signed)
Pt left voice message requesting Dr. Jordan Likes call him @ [phone].  --No reason listed.  --glh

## 2020-01-31 ENCOUNTER — Telehealth: Payer: Self-pay | Admitting: Family Medicine

## 2020-01-31 NOTE — Telephone Encounter (Signed)
Patient apologizes for missing provider's call... please call him back, promises to answer.  -Dr Jordan Likes  --glh

## 2020-01-31 NOTE — Telephone Encounter (Signed)
Left VM for patient. If he calls back please have him speak with a nurse/CMA and ask what he would like to discuss.   If any questions then please take the best time and phone number to call and I will try to call him back.   Myra Rude, MD [facility] 01/31/2020, 8:55 AM

## 2020-01-31 NOTE — Telephone Encounter (Signed)
Talked to patient about questions.   Myra Rude, MD [facility] 01/31/2020, 11:04 AM

## 2020-02-27 ENCOUNTER — Encounter: Payer: Self-pay | Admitting: Family Medicine

## 2020-02-27 ENCOUNTER — Ambulatory Visit (INDEPENDENT_AMBULATORY_CARE_PROVIDER_SITE_OTHER): Payer: BC Managed Care – PPO | Admitting: Family Medicine

## 2020-02-27 ENCOUNTER — Telehealth: Payer: Self-pay | Admitting: Family Medicine

## 2020-02-27 ENCOUNTER — Other Ambulatory Visit: Payer: Self-pay

## 2020-02-27 ENCOUNTER — Ambulatory Visit: Payer: Self-pay

## 2020-02-27 VITALS — BP 127/79 | HR 96 | Ht 66.0 in | Wt 130.0 lb

## 2020-02-27 DIAGNOSIS — M25562 Pain in left knee: Secondary | ICD-10-CM

## 2020-02-27 MED ORDER — TRIAMCINOLONE ACETONIDE 40 MG/ML IJ SUSP
40.0000 mg | Freq: Once | INTRAMUSCULAR | Status: AC
  Administered 2020-02-27: 11:00:00 40 mg via INTRA_ARTICULAR

## 2020-02-27 NOTE — Telephone Encounter (Signed)
Provided updated work note.   Myra Rude, MD [facility] 02/27/2020, 3:59 PM

## 2020-02-27 NOTE — Telephone Encounter (Signed)
Patient states he gave our work note to his Employer, they state it doesn't mention his authorized/ recommended usage of the (cane). ---He needs a letter clarifying his need of Gilmer Mor usage & extended work restrictions.  ---please send updated Work note to [employer] (this spelling maybe wrong @ [phone].  --glh

## 2020-02-27 NOTE — Assessment & Plan Note (Signed)
MRI was revealing for a intraosseous ganglion with mild marrow edema, and indeterminate MCL sprain and mild distal quadriceps tendinosis.  Pain is ongoing over the medial compartment.  We have done several weeks of physical therapy. -Counseled on supportive care. -Injection. -If no improvement will consider gel injection. -Provided work note.

## 2020-02-27 NOTE — Progress Notes (Signed)
Benjamin Johnson - 58 y.o. male MRN [id number]  Date of birth: 1960-07-22  SUBJECTIVE:  Including CC & ROS.  Chief Complaint  Patient presents with  . Follow-up    left knee    Benjamin Johnson is a 59 y.o. male that is presenting with ongoing left knee pain.  MRI was revealing for intraosseous ganglion, changes of the MCL and of the distal quadriceps tendon.  Has been through physical therapy.  Pain is still ongoing from an injury sustained at work.   Review of Systems See HPI   HISTORY: Past Medical, Surgical, Social, and Family History Reviewed & Updated per EMR.   Pertinent Historical Findings include:  Past Medical History:  Diagnosis Date  . History of gallstones 1981    Past Surgical History:  Procedure Laterality Date  . ERCP  1981  . LUMBAR DISC SURGERY  1989    Family History  Problem Relation Age of Onset  . Cancer Father 12       colon  . Hyperlipidemia Neg Hx   . Hypertension Neg Hx   . Diabetes Neg Hx     Social History   Socioeconomic History  . Marital status: Married    Spouse name: Not on file  . Number of children: Not on file  . Years of education: Not on file  . Highest education level: Not on file  Occupational History  . Not on file  Tobacco Use  . Smoking status: Current Every Day Smoker    Years: 30.00    Types: Cigarettes  . Smokeless tobacco: Never Used  Substance and Sexual Activity  . Alcohol use: Yes    Alcohol/week: 6.0 standard drinks    Types: 6 Cans of beer per week  . Drug use: No  . Sexual activity: Not on file  Other Topics Concern  . Not on file  Social History Narrative   Married since age 43   Twin girls (age 20) one is an Financial controller.  Grandson lives with them (age 39). One daughter in [place]   Son (age 13)  He has one son   Enjoys fishing, English as a second language teacher   Works as an Probation officer   Completed 12th grade      Social Determinants of Corporate investment banker Strain:   . Difficulty of Paying Living Expenses:     Food Insecurity:   . Worried About Programme researcher, broadcasting/film/video in the Last Year:   . Barista in the Last Year:   Transportation Needs:   . Freight forwarder (Medical):   Marland Kitchen Lack of Transportation (Non-Medical):   Physical Activity:   . Days of Exercise per Week:   . Minutes of Exercise per Session:   Stress:   . Feeling of Stress :   Social Connections:   . Frequency of Communication with Friends and Family:   . Frequency of Social Gatherings with Friends and Family:   . Attends Religious Services:   . Active Member of Clubs or Organizations:   . Attends Banker Meetings:   Marland Kitchen Marital Status:   Intimate Partner Violence:   . Fear of Current or Ex-Partner:   . Emotionally Abused:   Marland Kitchen Physically Abused:   . Sexually Abused:      PHYSICAL EXAM:  VS: BP 127/79   Pulse 96   Ht 5\' 6"  (1.676 m)   Wt 130 lb (59 kg)   BMI 20.98 kg/m  Physical Exam Gen: NAD, alert, cooperative  with exam, well-appearing MSK:  Left knee: No obvious effusion. Normal range of motion. Tenderness palpation of the medial compartment. Neurovascularly intact   Aspiration/Injection Procedure Note Benjamin Johnson 08-Oct-1960  Procedure: Injection Indications: Left knee pain  Procedure Details Consent: Risks of procedure as well as the alternatives and risks of each were explained to the (patient/caregiver).  Consent for procedure obtained. Time Out: Verified patient identification, verified procedure, site/side was marked, verified correct patient position, special equipment/implants available, medications/allergies/relevent history reviewed, required imaging and test results available.  Performed.  The area was cleaned with iodine and alcohol swabs.    The left knee superior lateral suprapatellar pouch was injected using 1 cc's of 40 mg Kenalog and 4 cc's of 0.25% bupivacaine with a 22 1 1/2" needle.  Ultrasound was used. Images were obtained in long views showing the injection.      A sterile dressing was applied.  Patient did tolerate procedure well.     ASSESSMENT & PLAN:   Acute pain of left knee MRI was revealing for a intraosseous ganglion with mild marrow edema, and indeterminate MCL sprain and mild distal quadriceps tendinosis.  Pain is ongoing over the medial compartment.  We have done several weeks of physical therapy. -Counseled on supportive care. -Injection. -If no improvement will consider gel injection. -Provided work note.

## 2020-02-27 NOTE — Patient Instructions (Signed)
Good to see you  Please try ice  Please continue the compression  Please send me a message in MyChart with any questions or updates.  Please see me back in 4 weeks.   --Dr. Jordan Likes

## 2020-03-03 ENCOUNTER — Telehealth: Payer: Self-pay | Admitting: Family Medicine

## 2020-03-03 NOTE — Telephone Encounter (Signed)
Pt called w/ twofold question :   -- Patient wonders if after Lt knee injection should he again be having the same pain ? (he states its kind of hurting worst than before injection) .  2ndly --Patient checking on status of WC forms given to provider are ready for pick up/submission to [facility] carrier?  --Forwarding message to med asst fr review w/provider.  --glh

## 2020-03-03 NOTE — Telephone Encounter (Signed)
Pt called w/ twofold question :   -- Patient wonders if after Lt knee injection should he again be having the same pain ? (he states its kind of hurting worst than before injection) .  2ndly --Patient checking on status of WC forms given to provider are ready for pick up/submission to [facility] carrier?  --Forwarding message to med asst fr review w/provider.  --glh      Documentation    ---8/16 Called pt to update on status of  (Forms)-- He also inquired what provider thought about the pain he is experiencing in the same knee injection--ask for provider call back.  -glh

## 2020-03-04 NOTE — Telephone Encounter (Signed)
Answered patient's questions and informed paperwork completed.   Myra Rude, MD [facility] 03/04/2020, 1:55 PM

## 2020-04-03 ENCOUNTER — Other Ambulatory Visit: Payer: Self-pay

## 2020-04-03 ENCOUNTER — Telehealth: Payer: Self-pay | Admitting: Family Medicine

## 2020-04-03 ENCOUNTER — Ambulatory Visit (INDEPENDENT_AMBULATORY_CARE_PROVIDER_SITE_OTHER): Payer: BC Managed Care – PPO | Admitting: Family Medicine

## 2020-04-03 DIAGNOSIS — M25562 Pain in left knee: Secondary | ICD-10-CM

## 2020-04-03 NOTE — Patient Instructions (Signed)
Good to see you The orthopedists will call to setup an appointment   Please send me a message in MyChart with any questions or updates.  Please see [place] back as needed.   --Dr. Jordan Likes

## 2020-04-03 NOTE — Progress Notes (Signed)
Benjamin Johnson - 59 y.o. male MRN [id number]  Date of birth: 04/01/1961  SUBJECTIVE:  Including CC & ROS.  No chief complaint on file.   Benjamin Johnson is a 59 y.o. male that is following up for his left knee pain.  We have tried physical therapy and injections as well as bracing.  He denies any significant improvement.  Pain is been ongoing since his work incident earlier this year.  Pain seems to be worse in the morning and tender to touch in the medial compartment.   Review of Systems See HPI   HISTORY: Past Medical, Surgical, Social, and Family History Reviewed & Updated per EMR.   Pertinent Historical Findings include:  Past Medical History:  Diagnosis Date  . History of gallstones 1981    Past Surgical History:  Procedure Laterality Date  . ERCP  1981  . LUMBAR DISC SURGERY  1989    Family History  Problem Relation Age of Onset  . Cancer Father 40       colon  . Hyperlipidemia Neg Hx   . Hypertension Neg Hx   . Diabetes Neg Hx     Social History   Socioeconomic History  . Marital status: Married    Spouse name: Not on file  . Number of children: Not on file  . Years of education: Not on file  . Highest education level: Not on file  Occupational History  . Not on file  Tobacco Use  . Smoking status: Current Every Day Smoker    Years: 30.00    Types: Cigarettes  . Smokeless tobacco: Never Used  Substance and Sexual Activity  . Alcohol use: Yes    Alcohol/week: 6.0 standard drinks    Types: 6 Cans of beer per week  . Drug use: No  . Sexual activity: Not on file  Other Topics Concern  . Not on file  Social History Narrative   Married since age 74   Twin girls (age 55) one is an Financial controller.  Grandson lives with them (age 52). 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 Difficulty of Paying  Living Expenses: Not on file  Food Insecurity:   . Worried About Programme researcher, broadcasting/film/video in the Last Year: Not on file  . Ran Out of Food in the Last Year: Not on file  Transportation Needs:   . Lack of Transportation (Medical): Not on file  . Lack of Transportation (Non-Medical): Not on file  Physical Activity:   . Days of Exercise per Week: Not on file  . Minutes of Exercise per Session: Not on file  Stress:   . Feeling of Stress : Not on file  Social Connections:   . Frequency of Communication with Friends and Family: Not on file  . Frequency of Social Gatherings with Friends and Family: Not on file  . Attends Religious Services: Not on file  . Active Member of Clubs or Organizations: Not on file  . Attends Banker Meetings: Not on file  . Marital Status: Not on file  Intimate Partner Violence:   . Fear of Current or Ex-Partner: Not on file  . Emotionally Abused: Not on file  . Physically Abused: Not on file  . Sexually Abused: Not on file  PHYSICAL EXAM:  VS: There were no vitals taken for this visit. Physical Exam Gen: NAD, alert, cooperative with exam, well-appearing    ASSESSMENT & PLAN:   Acute pain of left knee Pain is ongoing stemming from a work-related incident.  Has not responded to conservative therapy thus far.  Unclear if this is related to any intra-articular origin as he did not get much improvement with the steroid injection.  Did have an MCL change on the MRI but that would have likely healed by now.  Unsure if the interosseous ganglion is contributing to any of his pain. -Counseled on supportive care. -Provided work note. -Referral to orthopedic surgery.

## 2020-04-03 NOTE — Assessment & Plan Note (Signed)
Pain is ongoing stemming from a work-related incident.  Has not responded to conservative therapy thus far.  Unclear if this is related to any intra-articular origin as he did not get much improvement with the steroid injection.  Did have an MCL change on the MRI but that would have likely healed by now.  Unsure if the interosseous ganglion is contributing to any of his pain. -Counseled on supportive care. -Provided work note. -Referral to orthopedic surgery.

## 2020-04-03 NOTE — Telephone Encounter (Signed)
Pt request RTW note be faxed to [facility] @ [employer] p) [phone] --[phone].  Done--glh

## 2020-04-04 ENCOUNTER — Telehealth: Payer: Self-pay | Admitting: Family Medicine

## 2020-04-04 NOTE — Telephone Encounter (Signed)
Casemgr Karoline Caldwell Edwards @ [facility] [phone] she will be obtaining the appt for pt @ external provider ([facility]) --Fausto Skillern

## 2020-07-19 HISTORY — PX: KNEE SURGERY: SHX244

## 2021-06-08 IMAGING — DX DG KNEE COMPLETE 4+V*L*
4 series · 4 of 4 positions shown · non-contrast
Comparison: No prior.

CLINICAL DATA: Left knee pain.

EXAM:
LEFT KNEE - COMPLETE 4+ VIEW

[knee ap]
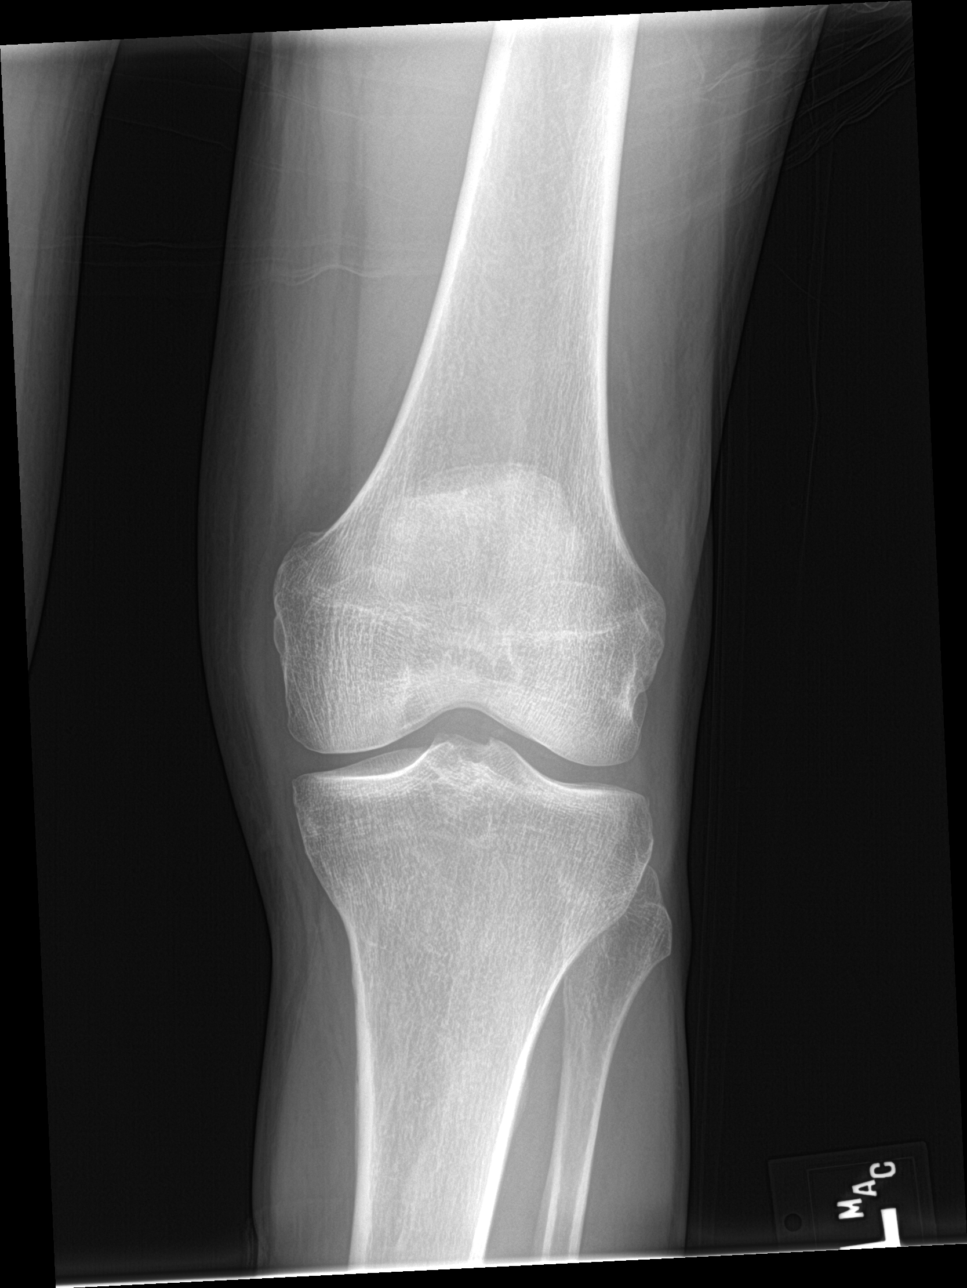

[knee lat]
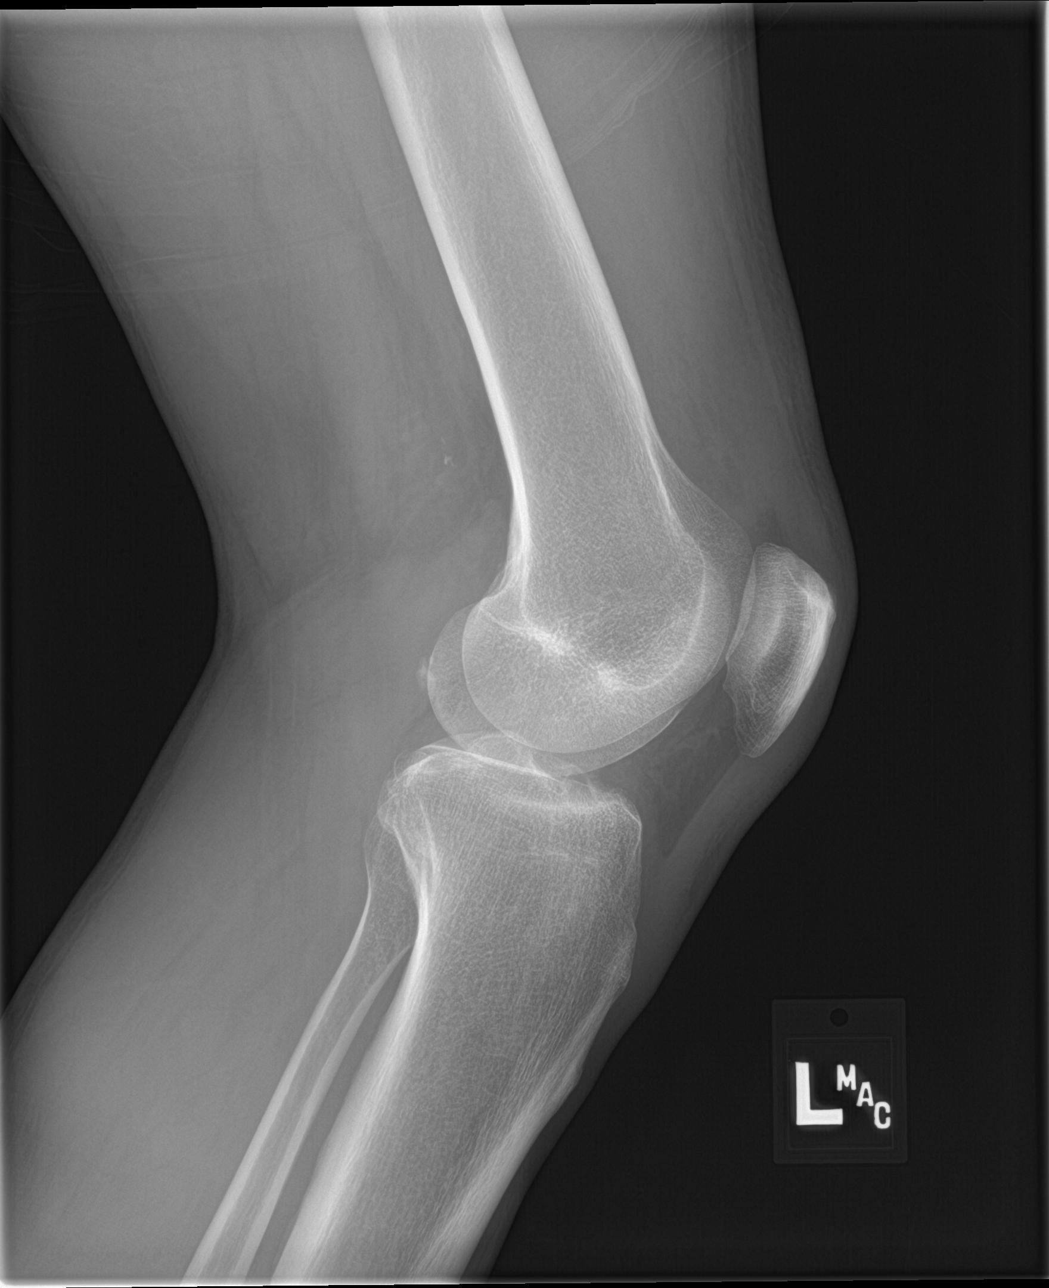

[knee obl (1 of 2)]
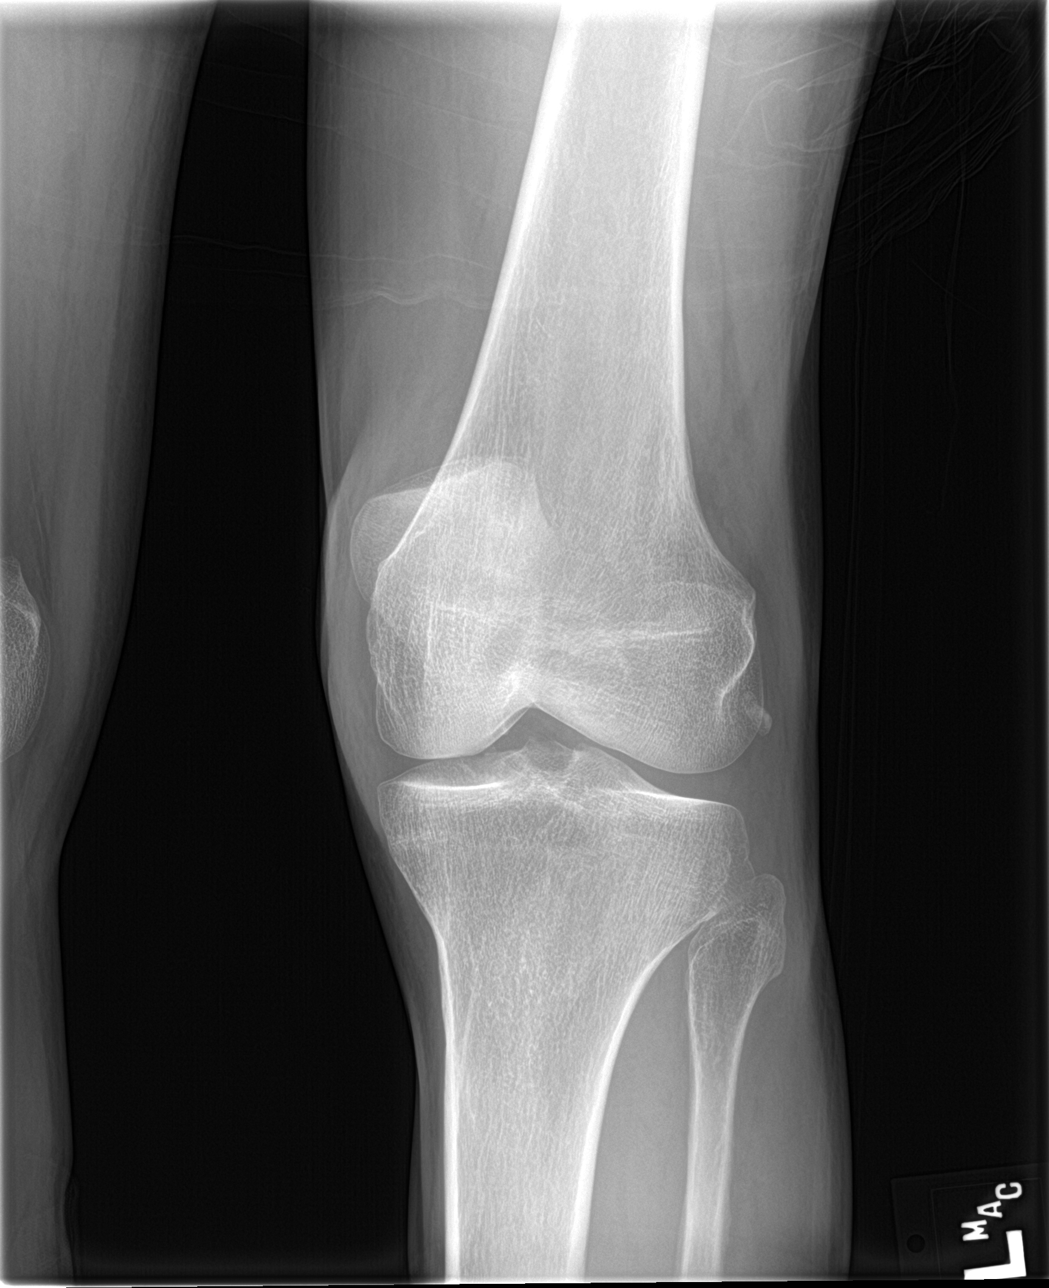

[knee obl (2 of 2)]
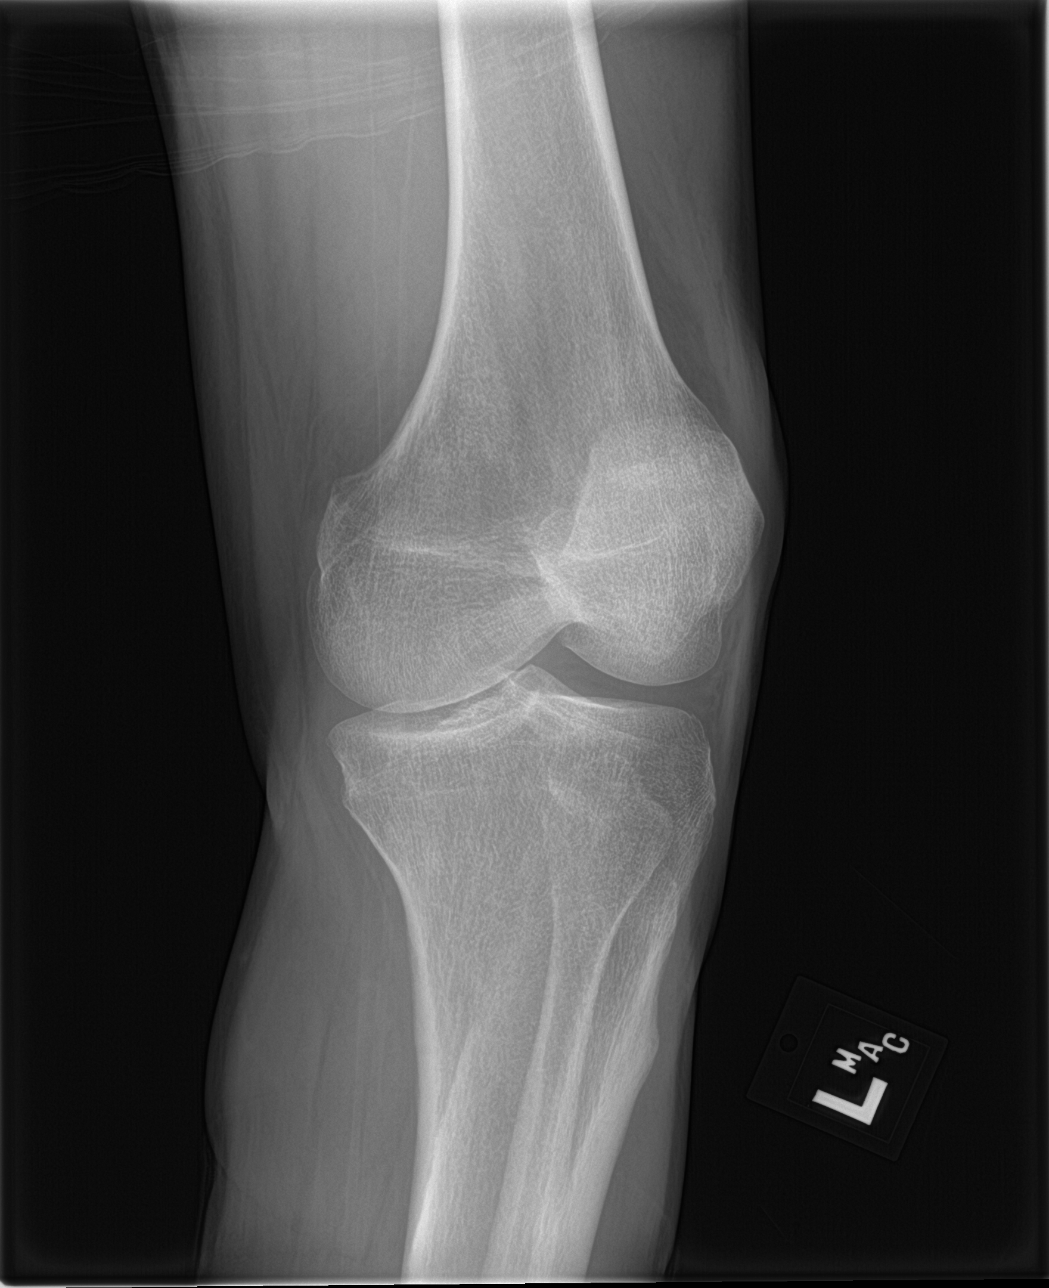

[4 of 4 positions shown; findings below may reference images not displayed]

FINDINGS: No acute bony or joint abnormality identified. No evidence of
fracture or dislocation. Tiny loose body in the femoral tibial joint
space cannot be excluded. Mild peripheral vascular calcification
cannot be exclude.
IMPRESSION: 1.  No acute bony abnormality identified.

2. Tiny loose body in the femoral tibial joint space cannot be
excluded.

3. Mild peripheral vascular calcification cannot be excluded. This
suggest peripheral vascular disease.

## 2021-12-09 ENCOUNTER — Ambulatory Visit: Payer: Commercial Managed Care - HMO | Admitting: Family Medicine

## 2021-12-25 ENCOUNTER — Encounter: Payer: Self-pay | Admitting: Family Medicine

## 2021-12-25 ENCOUNTER — Ambulatory Visit (INDEPENDENT_AMBULATORY_CARE_PROVIDER_SITE_OTHER): Payer: Commercial Managed Care - HMO | Admitting: Family Medicine

## 2021-12-25 VITALS — BP 109/73 | HR 68 | Ht 65.0 in | Wt 112.4 lb

## 2021-12-25 DIAGNOSIS — Z1159 Encounter for screening for other viral diseases: Secondary | ICD-10-CM | POA: Diagnosis not present

## 2021-12-25 DIAGNOSIS — E782 Mixed hyperlipidemia: Secondary | ICD-10-CM | POA: Insufficient documentation

## 2021-12-25 DIAGNOSIS — F17200 Nicotine dependence, unspecified, uncomplicated: Secondary | ICD-10-CM

## 2021-12-25 DIAGNOSIS — R634 Abnormal weight loss: Secondary | ICD-10-CM

## 2021-12-25 LAB — CBC
HCT: 47.9 % (ref 39.0–52.0)
Hemoglobin: 15.9 g/dL (ref 13.0–17.0)
MCHC: 33.2 g/dL (ref 30.0–36.0)
MCV: 94.3 fl (ref 78.0–100.0)
Platelets: 251 10*3/uL (ref 150.0–400.0)
RBC: 5.08 Mil/uL (ref 4.22–5.81)
RDW: 13.8 % (ref 11.5–15.5)
WBC: 4.3 10*3/uL (ref 4.0–10.5)

## 2021-12-25 LAB — LIPID PANEL
Cholesterol: 223 mg/dL — ABNORMAL HIGH (ref 0–200)
HDL: 71.7 mg/dL (ref >39.00)
LDL Cholesterol: 141 mg/dL — ABNORMAL HIGH (ref 0–99)
NonHDL: 151.29
Total CHOL/HDL Ratio: 3
Triglycerides: 49 mg/dL (ref 0.0–149.0)
VLDL: 9.8 mg/dL (ref 0.0–40.0)

## 2021-12-25 LAB — COMPREHENSIVE METABOLIC PANEL
ALT: 16 U/L (ref 0–53)
AST: 16 U/L (ref 0–37)
Albumin: 4.3 g/dL (ref 3.5–5.2)
Alkaline Phosphatase: 90 U/L (ref 39–117)
BUN: 13 mg/dL (ref 6–23)
CO2: 30 mEq/L (ref 19–32)
Calcium: 10.1 mg/dL (ref 8.4–10.5)
Chloride: 102 mEq/L (ref 96–112)
Creatinine, Ser: 1.01 mg/dL (ref 0.40–1.50)
GFR: 79.6 mL/min (ref >60.00)
Glucose, Bld: 76 mg/dL (ref 70–99)
Potassium: 5 mEq/L (ref 3.5–5.1)
Sodium: 138 mEq/L (ref 135–145)
Total Bilirubin: 0.4 mg/dL (ref 0.2–1.2)
Total Protein: 7.4 g/dL (ref 6.0–8.3)

## 2021-12-25 LAB — TSH: TSH: 0.59 u[IU]/mL (ref 0.35–5.50)

## 2021-12-25 LAB — HEMOGLOBIN A1C: Hgb A1c MFr Bld: 6 % (ref 4.6–6.5)

## 2021-12-25 MED ORDER — NICOTINE 21 MG/24HR TD PT24: 21.0000 mg | MEDICATED_PATCH | TRANSDERMAL | 0 refills | Status: DC

## 2021-12-25 MED ORDER — ATORVASTATIN CALCIUM 20 MG PO TABS: 20.0000 mg | ORAL_TABLET | Freq: Every day | ORAL | 3 refills | Status: DC

## 2021-12-25 NOTE — Patient Instructions (Signed)
Thank you for choosing [facility] for your Primary Care needs. I am excited for the opportunity to partner with you to meet your health care goals. It was a pleasure meeting you today!   Information on diet, exercise, and health maintenance recommendations are listed below. This is information to help you be sure you are on track for optimal health and monitoring.   Please look over this and let us know if you have any questions or if you have completed any of the health maintenance outside of  so that we can be sure your records are up to date.  ___________________________________________________________  MyChart:  For all urgent or time sensitive needs we ask that you please call the office to avoid delays. Our number is [phone]. MyChart is not constantly monitored and due to the large volume of messages a day, replies may take up to 72 business hours.  MyChart Policy: MyChart allows for you to see your visit notes, after visit summary, provider recommendations, lab and tests results, make an appointment, request refills, and contact your provider or the office for non-urgent questions or concerns. Providers are seeing patients during normal business hours and do not have built in time to review MyChart messages.  We ask that you allow a minimum of 3 business days for responses to MyChart messages. For this reason, please do not send urgent requests through MyChart. Please call the office at [phone]. New and ongoing conditions may require a visit. We have virtual and in-person visits available for your convenience.  Complex MyChart concerns may require a visit. Your provider may request you schedule a virtual or in-person visit to ensure we are providing the best care possible. MyChart messages sent after 11:00 AM on Friday will not be received by the provider until Monday morning.    Lab and Test Results: You will receive your lab and  test results on MyChart as soon as they are completed and results have been sent by the lab or testing facility. Due to this service, you will receive your results BEFORE your provider.  I review lab and test results each morning prior to seeing patients. Some results require collaboration with other providers to ensure you are receiving the most appropriate care. For this reason, we ask that you please allow a minimum of 3-5 business days from the time that ALL results have been received for your provider to receive and review lab and test results and contact you about these.  Most lab and test result comments from the provider will be sent through MyChart. Your provider may recommend changes to the plan of care, follow-up visits, repeat testing, ask questions, or request an office visit to discuss these results. You may reply directly to this message or call the office to provide information for the provider or set up an appointment. In some instances, you will be called with test results and recommendations. Please let us know if this is preferred and we will make note of this in your chart to provide this for you.    If you have not heard a response to your lab or test results in 5 business days from all results returning to MyChart, please call the office to let us know. We ask that you please avoid calling prior to this time unless there is an emergent concern. Due to high call volumes, this can delay the resulting process.  After Hours: For all non-emergency after hours needs,   please call the office at [phone] and select the option to reach the on-call  service. On-call services are shared between multiple [facility] offices and therefore it will not be possible to speak directly with your provider. On-call providers may provide medical advice and recommendations, but are unable to provide refills for maintenance medications.  For all emergency or urgent medical needs after normal business  hours, we recommend that you seek care at the closest Urgent Care or Emergency Department to ensure appropriate treatment in a timely manner.  [facility] has a 24 hour emergency room located on the ground floor for your convenience.   Urgent Concerns During the Business Day Providers are seeing patients from 8AM to 5PM with a busy schedule and are most often not able to respond to non-urgent calls until the end of the day or the next business day. If you should have URGENT concerns during the day, please call and speak to the nurse or schedule a same day appointment so that we can address your concern without delay.   Thank you, again, for choosing me as your health care partner. I appreciate your trust and look forward to learning more about you.   Alif Petrak B. Solan Vosler, DNP, FNP-C  ___________________________________________________________  Health Maintenance Recommendations Screening Testing Mammogram Every 1-2 years based on history and risk factors Starting at age 50 Pap Smear Ages 21-39 every 3 years Ages 30-65 every 5 years with HPV testing More frequent testing may be required based on results and history Colon Cancer Screening Every 1-10 years based on test performed, risk factors, and history Starting at age 45 Bone Density Screening Every 2-10 years based on history Starting at age 65 for women Recommendations for men differ based on medication usage, history, and risk factors AAA Screening One time ultrasound Men 65-75 years old who have ever smoked Lung Cancer Screening Low Dose Lung CT every 12 months Age 50-80 years with a 20 pack-year smoking history who still smoke or who have quit within the last 15 years  Screening Labs Routine  Labs: Complete Blood Count (CBC), Complete Metabolic Panel (CMP), Cholesterol (Lipid Panel) Every 6-12 months based on history and medications May be recommended more frequently based on current conditions or  previous results Hemoglobin A1c Lab Every 3-12 months based on history and previous results Starting at age 45 or earlier with diagnosis of diabetes, high cholesterol, BMI >26, and/or risk factors Frequent monitoring for patients with diabetes to ensure blood sugar control Thyroid Panel (TSH w/ T3 & T4) Every 6 months based on history, symptoms, and risk factors May be repeated more often if on medication HIV One time testing for all patients 13 and older May be repeated more frequently for patients with increased risk factors or exposure Hepatitis C One time testing for all patients 18 and older May be repeated more frequently for patients with increased risk factors or exposure Gonorrhea, Chlamydia Every 12 months for all sexually active persons 13-24 years Additional monitoring may be recommended for those who are considered high risk or who have symptoms PSA Men 40-54 years old with risk factors Additional screening may be recommended from age 55-69 based on risk factors, symptoms, and history  Vaccine Recommendations Tetanus Booster All adults every 10 years Flu Vaccine All patients 6 months and older every year COVID Vaccine All patients 12 years and older Initial dosing with booster May recommend additional booster based on age and health history HPV Vaccine 2 doses all patients   age 9-26 Dosing may be considered for patients over 26 Shingles Vaccine (Shingrix) 2 doses all adults 50 years and older Pneumonia (Pneumovax 23) All adults 65 years and older May recommend earlier dosing based on health history Pneumonia (Prevnar 13) All adults 65 years and older Dosed 1 year after Pneumovax 23 Pneumonia (Prevnar 20) All adults 65 years and older (adults 19-64 with certain conditions or risk factors) 1 dose  For those who have no received Prevnar 13 vaccine previously   Additional Screening, Testing, and Vaccinations may be recommended on an individualized basis based on  family history, health history, risk factors, and/or exposure.  __________________________________________________________  Diet Recommendations for All Patients  I recommend that all patients maintain a diet low in saturated fats, carbohydrates, and cholesterol. While this can be challenging at first, it is not impossible and small changes can make big differences.  Things to try: Decreasing the amount of soda, sweet tea, and/or juice to one or less per day and replace with water While water is always the first choice, if you do not like water you may consider adding a water additive without sugar to improve the taste other sugar free drinks Replace potatoes with a brightly colored vegetable at dinner Use healthy oils, such as canola oil or olive oil, instead of butter or hard margarine Limit your bread intake to two pieces or less a day Replace regular pasta with low carb pasta options Bake, broil, or grill foods instead of frying Monitor portion sizes  Eat smaller, more frequent meals throughout the day instead of large meals  An important thing to remember is, if you love foods that are not great for your health, you don't have to give them up completely. Instead, allow these foods to be a reward when you have done well. Allowing yourself to still have special treats every once in a while is a nice way to tell yourself thank you for working hard to keep yourself healthy.   Also remember that every day is a new day. If you have a bad day and "fall off the wagon", you can still climb right back up and keep moving along on your journey!  We have resources available to help you!  Some websites that may be helpful include: www.MyPlate.gov  Www.VeryWellFit.com _____________________________________________________________  Activity Recommendations for All Patients  I recommend that all adults get at least 20 minutes of moderate physical activity that elevates your heart rate at least 5  days out of the week.  Some examples include: Walking or jogging at a pace that allows you to carry on a conversation Cycling (stationary bike or outdoors) Water aerobics Yoga Weight lifting Dancing If physical limitations prevent you from putting stress on your joints, exercise in a pool or seated in a chair are excellent options.  Do determine your MAXIMUM heart rate for activity: 220 - YOUR AGE = MAX Heart Rate   Remember! Do not push yourself too hard.  Start slowly and build up your pace, speed, weight, time in exercise, etc.  Allow your body to rest between exercise and get good sleep. You will need more water than normal when you are exerting yourself. Do not wait until you are thirsty to drink. Drink with a purpose of getting in at least 8, 8 ounce glasses of water a day plus more depending on how much you exercise and sweat.    If you begin to develop dizziness, chest pain, abdominal pain, jaw pain, shortness of breath, headache,   vision changes, lightheadedness, or other concerning symptoms, stop the activity and allow your body to rest. If your symptoms are severe, seek emergency evaluation immediately. If your symptoms are concerning, but not severe, please let us know so that we can recommend further evaluation.     

## 2021-12-25 NOTE — Progress Notes (Signed)
New Patient Office Visit  Subjective    Patient ID: Benjamin Johnson, male    DOB: [birth year]  Age: 61 y.o. MRN: [id number]  CC: establish care  HPI Benjamin Johnson presents to establish care  Patient reports he has been doing really well overall. No healthy history. States he would like some blood work today to check to see if there is any cause for some weight loss. He has lost mayb 8 pounds over the past 6-8 months or so. States he has always been normal/underweight and the most he has ever weighed is 127 lbs. Reports that he will have some occasional tiredness, but no excessive fatigue. He denies any bleeding, nausea, vomiting, diarrhea, urinary changes, fevers, body aches, chest pain, dyspnea, edema, headaches, vision changes. Reports he eats good - at least 2-3 meals per day with snacks and is intentional about protein intake.   He is motivated to quite smoking. States he has tried cold-turkey in the past, but that was not very effective and he started back up right away. He is interested in trying patches for now. States he has been smoking 0.5-1 pack per day for the past 40+ years.   He is sexually active with male partner (wife, monogamous) and does not have any concerns for STDs at this time.   Reports his last colonoscopy was normal in 2021 with 10-year repeat recommended. Will request records.      Outpatient Encounter Medications as of 12/25/2021  Medication Sig   nicotine (NICODERM CQ - DOSED IN MG/24 HOURS) 21 mg/24hr patch Place 1 patch (21 mg total) onto the skin daily.   [DISCONTINUED] brompheniramine-pseudoephedrine-DM 30-2-10 MG/5ML syrup Take 5 mLs by mouth 4 (four) times daily as needed.   [DISCONTINUED] Diclofenac Sodium 1.5 % SOLN Apply 40 drops topically 4 (four) times daily as needed.   [DISCONTINUED] meloxicam (MOBIC) 7.5 MG tablet Take 1 tablet (7.5 mg total) by mouth daily.   [DISCONTINUED] naproxen (NAPROSYN) 500 MG tablet Take 1 tablet (500 mg total) by mouth  2 (two) times daily.   [DISCONTINUED] omeprazole (PRILOSEC) 20 MG capsule Take 1 capsule (20 mg total) by mouth daily.   [DISCONTINUED] ondansetron (ZOFRAN) 4 MG tablet Take 1 tablet (4 mg total) by mouth every 6 (six) hours.   [DISCONTINUED] sildenafil (REVATIO) 20 MG tablet Take 1-2 tablets by mouth prior to intercourse   [DISCONTINUED] traMADol (ULTRAM) 50 MG tablet Take 2 tablets (100 mg total) by mouth every 6 (six) hours as needed.   No facility-administered encounter medications on file as of 12/25/2021.    Past Medical History:  Diagnosis Date   Allergy    Arthritis    History of gallstones 07/20/1979    Past Surgical History:  Procedure Laterality Date   ERCP  07/20/1979   KNEE SURGERY Left 2022   LUMBAR DISC SURGERY  07/20/1987    Family History  Problem Relation Age of Onset   Cancer Father [id number]       colon   Hyperlipidemia Neg Hx    Hypertension Neg Hx    Diabetes Neg Hx     Social History   Socioeconomic History   Marital status: Married    Spouse name: Not on file   Number of children: Not on file   Years of education: Not on file   Highest education level: Not on file  Occupational History   Not on file  Tobacco Use   Smoking status: Every Day    Years: 30.00  Types: Cigarettes   Smokeless tobacco: Never  Substance and Sexual Activity   Alcohol use: Yes    Alcohol/week: 6.0 standard drinks of alcohol    Types: 6 Cans of beer per week   Drug use: No   Sexual activity: Yes  Other Topics Concern   Not on file  Social History Narrative   Married since age 47   Twin girls (age 14) one is an Financial controller.  Grandson lives with them (age 8). One daughter in [place]   Son (age 79)  He has one son   Enjoys fishing, English as a second language teacher   Works as an Probation officer   Completed 12th grade      Social Determinants of Corporate investment banker Strain: Not on Ship broker Insecurity: Not on file  Transportation Needs: Not on file  Physical Activity: Not on file   Stress: Not on file  Social Connections: Not on file  Intimate Partner Violence: Not on file    ROS All review of systems negative except what is listed in the HPI      Objective    BP 109/73   Pulse 68   Ht 5\' 5"  (1.651 m)   Wt 112 lb 6.4 oz (51 kg)   BMI 18.70 kg/m   Physical Exam Vitals reviewed.  Constitutional:      Appearance: Normal appearance.  HENT:     Head: Normocephalic and atraumatic.  Cardiovascular:     Rate and Rhythm: Normal rate and regular rhythm.     Pulses: Normal pulses.     Heart sounds: Normal heart sounds.  Pulmonary:     Effort: Pulmonary effort is normal.     Breath sounds: Normal breath sounds.  Musculoskeletal:        General: Normal range of motion.     Cervical back: Normal range of motion and neck supple.  Skin:    General: Skin is warm and dry.     Capillary Refill: Capillary refill takes less than 2 seconds.  Neurological:     General: No focal deficit present.     Mental Status: He is alert and oriented to person, place, and time. Mental status is at baseline.  Psychiatric:        Mood and Affect: Mood normal.        Behavior: Behavior normal.        Thought Content: Thought content normal.        Judgment: Judgment normal.         Assessment & Plan:   1. Weight loss Patient is down about 8 pounds over the past 6-8 months without trying. States he has always been slightly underweight, but he wanted to make sure everything was okay. No other symptoms. Will update labs today. Encouraged high protein diet and weight bearing exercises.  - CBC - Comprehensive metabolic panel - Lipid panel - TSH - Hemoglobin A1c  2. Tobacco dependence Discussed smoking cessation and education provided.  He is agreeable to lung cancer screening program referral.  Asymptomatic. Doing well overall.  He is motivated to quit and would like to start with trying patches for now. Prescription sent in, but if too expensive he can try OTC. Will  taper down as needed after first step.  - Ambulatory Referral Lung Cancer Screening [facility] - CBC - Comprehensive metabolic panel - Lipid panel - Hemoglobin A1c - nicotine (NICODERM CQ - DOSED IN MG/24 HOURS) 21 mg/24hr patch; Place 1 patch (21 mg total) onto  the skin daily.  Dispense: 42 patch; Refill: 0  3. Encounter for hepatitis C screening test for low risk patient - Hepatitis C antibody   Return in about 6 months (around 06/26/2022) for routine f/u .   Clayborne Dana, NP

## 2021-12-25 NOTE — Progress Notes (Signed)
Smoking cessation labs

## 2021-12-25 NOTE — Addendum Note (Signed)
Addended by: Hyman Hopes B on: 12/25/2021 01:22 PM   Modules accepted: Orders

## 2021-12-28 ENCOUNTER — Telehealth: Payer: Self-pay | Admitting: *Deleted

## 2021-12-28 LAB — HEPATITIS C ANTIBODY
Hepatitis C Ab: NONREACTIVE
SIGNAL TO CUT-OFF: 0.08 (ref <1.00)

## 2021-12-28 NOTE — Telephone Encounter (Signed)
Opened in error

## 2022-02-18 ENCOUNTER — Other Ambulatory Visit: Payer: Self-pay | Admitting: *Deleted

## 2022-02-18 DIAGNOSIS — F17200 Nicotine dependence, unspecified, uncomplicated: Secondary | ICD-10-CM

## 2022-02-18 DIAGNOSIS — E782 Mixed hyperlipidemia: Secondary | ICD-10-CM

## 2022-02-18 MED ORDER — NICOTINE 21 MG/24HR TD PT24: 21.0000 mg | MEDICATED_PATCH | TRANSDERMAL | 0 refills | Status: DC

## 2022-02-18 MED ORDER — ATORVASTATIN CALCIUM 20 MG PO TABS: 20.0000 mg | ORAL_TABLET | Freq: Every day | ORAL | 2 refills | Status: DC

## 2022-02-23 ENCOUNTER — Other Ambulatory Visit: Payer: Self-pay

## 2022-02-23 DIAGNOSIS — Z122 Encounter for screening for malignant neoplasm of respiratory organs: Secondary | ICD-10-CM

## 2022-02-23 DIAGNOSIS — F17210 Nicotine dependence, cigarettes, uncomplicated: Secondary | ICD-10-CM

## 2022-02-23 DIAGNOSIS — Z87891 Personal history of nicotine dependence: Secondary | ICD-10-CM

## 2022-03-01 ENCOUNTER — Ambulatory Visit (INDEPENDENT_AMBULATORY_CARE_PROVIDER_SITE_OTHER): Payer: Commercial Managed Care - HMO | Admitting: Medical

## 2022-03-01 VITALS — BP 121/88 | HR 73 | Temp 98.2°F | Resp 16 | Wt 124.0 lb

## 2022-03-01 DIAGNOSIS — R0981 Nasal congestion: Secondary | ICD-10-CM

## 2022-03-01 DIAGNOSIS — J3489 Other specified disorders of nose and nasal sinuses: Secondary | ICD-10-CM

## 2022-03-01 MED ORDER — AZITHROMYCIN 250 MG PO TABS: ORAL_TABLET | ORAL | 0 refills | Status: AC

## 2022-03-01 MED ORDER — AZELASTINE HCL 0.1 % NA SOLN: 2.0000 | Freq: Two times a day (BID) | NASAL | 3 refills | Status: DC

## 2022-03-01 NOTE — Progress Notes (Signed)
Subjective:    Patient ID: Benjamin Johnson, male    DOB: 05-May-1961, 61 y.o.   MRN: [id number]  HPI  Pt states for 3 weeks he has been nasal congested. States still feels congested and had colored mucus at one point when blew nose. Now only clear mucus. No pain on sinuses.  Pt is smoker. He is using patch and trying to quit. Down to 5 cigarettes a day now.  Pt has screening lung cancer screening upcoming.  Pt notices temperature changes cold to hot or hot to cold will cause nasal congestion.    Review of Systems  Constitutional:  Negative for chills, fatigue and fever.  HENT:  Negative for congestion, drooling and ear pain.   Respiratory:  Negative for cough, chest tightness and shortness of breath.   Cardiovascular:  Negative for chest pain and palpitations.  Gastrointestinal:  Negative for abdominal pain, constipation and diarrhea.  Genitourinary:  Negative for dysuria, flank pain and frequency.  Musculoskeletal:  Negative for back pain, joint swelling and neck pain.  Neurological:  Negative for dizziness, seizures, numbness and headaches.  Hematological:  Negative for adenopathy. Does not bruise/bleed easily.  Psychiatric/Behavioral:  Negative for behavioral problems, decreased concentration, dysphoric mood and hallucinations.     Past Medical History:  Diagnosis Date   Allergy    Arthritis    History of gallstones 07/20/1979     Social History   Socioeconomic History   Marital status: Married    Spouse name: Not on file   Number of children: Not on file   Years of education: Not on file   Highest education level: Not on file  Occupational History   Not on file  Tobacco Use   Smoking status: Every Day    Years: 30.00    Types: Cigarettes   Smokeless tobacco: Never  Substance and Sexual Activity   Alcohol use: Yes    Alcohol/week: 6.0 standard drinks of alcohol    Types: 6 Cans of beer per week   Drug use: No   Sexual activity: Yes  Other Topics Concern    Not on file  Social History Narrative   Married since age 65   Twin girls (age 26) one is an Financial controller.  Grandson lives with them (age 80). 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      Social Determinants of Corporate investment banker Strain: Not on file  Food Insecurity: Not on file  Transportation Needs: Not on file  Physical Activity: Not on file  Stress: Not on file  Social Connections: Not on file  Intimate Partner Violence: Not on file    Past Surgical History:  Procedure Laterality Date   ERCP  07/20/1979   KNEE SURGERY Left 2022   LUMBAR DISC SURGERY  07/20/1987    Family History  Problem Relation Age of Onset   Cancer Father 50       colon   Hyperlipidemia Neg Hx    Hypertension Neg Hx    Diabetes Neg Hx     No Known Allergies  Current Outpatient Medications on File Prior to Visit  Medication Sig Dispense Refill   atorvastatin (LIPITOR) 20 MG tablet Take 1 tablet (20 mg total) by mouth daily. 90 tablet 2   nicotine (NICODERM CQ - DOSED IN MG/24 HOURS) 21 mg/24hr patch Place 1 patch (21  mg total) onto the skin daily. 42 patch 0   No current facility-administered medications on file prior to visit.    BP 121/88 (BP Location: Right Arm, Patient Position: Sitting, Cuff Size: Small)   Pulse 73   Temp 98.2 F (36.8 C) (Oral)   Resp 16   Wt 124 lb (56.2 kg)   SpO2 100%   BMI 20.63 kg/m        Objective:   Physical Exam  General Mental Status- Alert. General Appearance- Not in acute distress.   Skin General: Color- Normal Color. Moisture- Normal Moisture.  Neck Carotid Arteries- Normal color. Moisture- Normal Moisture. No carotid bruits. No JVD.  Chest and Lung Exam Auscultation: Breath Sounds:-Normal.  Cardiovascular Auscultation:Rythm- Regular. Murmurs & Other Heart Sounds:Auscultation of the heart reveals- No  Murmurs.  Abdomen Inspection:-Inspeection Normal. Palpation/Percussion:Note:No mass. Palpation and Percussion of the abdomen reveal- Non Tender, Non Distended + BS, no rebound or guarding.   Neurologic Cranial Nerve exam:- CN III-XII intact(No nystagmus), symmetric smile. Strength:- 5/5 equal and symmetric strength both upper and lower extremities.   Heent- +pnd. Boggy turbinates.rt tm mild dull.        Assessment & Plan:   Patient Instructions  3 weeks nasal congestion and some recent frontal sinus pressure. Rt tm mild dull as well. Will prescribe azithromycin for possible developing ear infection and sinus infection.  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 Rx astelin nasal spray.  Follow thru with screening ct for lung cancer.  Follow up as regularly with pcp or sooner if needed.      Esperanza Richters, PA-C

## 2022-03-01 NOTE — Patient Instructions (Addendum)
3 weeks nasal congestion and some recent frontal sinus pressure. Rt tm mild dull as well. Will prescribe azithromycin for possible developing ear infection and sinus infection.  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 Rx astelin nasal spray.  Follow thru with screening ct for lung cancer.  Follow up as regularly with pcp or sooner if needed.

## 2022-03-09 ENCOUNTER — Encounter: Payer: Self-pay | Admitting: Acute Care

## 2022-03-09 ENCOUNTER — Ambulatory Visit (INDEPENDENT_AMBULATORY_CARE_PROVIDER_SITE_OTHER): Payer: Commercial Managed Care - HMO | Admitting: Acute Care

## 2022-03-09 DIAGNOSIS — F17210 Nicotine dependence, cigarettes, uncomplicated: Secondary | ICD-10-CM

## 2022-03-09 NOTE — Progress Notes (Signed)
Virtual Visit via Telephone Note  I connected with Benjamin Johnson on 06/02/21 at  2:00 PM EST by telephone and verified that I am speaking with the correct person using two identifiers.  Location: Patient: Home Provider: Working from home   I discussed the limitations, risks, security and privacy concerns of performing an evaluation and management service by telephone and the availability of in person appointments. I also discussed with the patient that there may be a patient responsible charge related to this service. The patient expressed understanding and agreed to proceed.  Shared Decision Making Visit Lung Cancer Screening Program [phone])   Eligibility: Age 61 y.o. Pack Years Smoking History Calculation 36 (# packs/per year x # years smoked) Recent History of coughing up blood  no Unexplained weight loss? no ( >Than 15 pounds within the last 6 months ) Prior History Lung / other cancer no (Diagnosis within the last 5 years already requiring surveillance chest CT Scans). Smoking Status Current Smoker Former Smokers: Years since quit: NA  Quit Date: NA  Visit Components: Discussion included one or more decision making aids. yes Discussion included risk/benefits of screening. yes Discussion included potential follow up diagnostic testing for abnormal scans. yes Discussion included meaning and risk of over diagnosis. yes Discussion included meaning and risk of False Positives. yes Discussion included meaning of total radiation exposure. yes  Counseling Included: Importance of adherence to annual lung cancer LDCT screening. yes Impact of comorbidities on ability to participate in the program. yes Ability and willingness to under diagnostic treatment. yes  Smoking Cessation Counseling: Current Smokers:  Discussed importance of smoking cessation. yes Information about tobacco cessation classes and interventions provided to patient. yes Patient provided with "ticket" for LDCT  Scan. yes Symptomatic Patient. yes  Counseling(Intermediate counseling: > three minutes) [address]406 Diagnosis Code: Tobacco Use Z72.0 Asymptomatic Patient no  Counseling NA Former Smokers:  Discussed the importance of maintaining cigarette abstinence. yes Diagnosis Code: Personal History of Nicotine Dependence. M08.676 Information about tobacco cessation classes and interventions provided to patient. Yes Patient provided with "ticket" for LDCT Scan. yes Written Order for Lung Cancer Screening with LDCT placed in Epic. Yes (CT Chest Lung Cancer Screening Low Dose W/O CM) [id number] Z12.2-Screening of respiratory organs Z87.891-Personal history of nicotine dependence   I spent 25 minutes of face to face time with him discussing the risks and benefits of lung cancer screening. We viewed a power point together that explained in detail the above noted topics. We took the time to pause the power point at intervals to allow for questions to be asked and answered to ensure understanding. We discussed that he had taken the single most powerful action possible to decrease his risk of developing lung cancer when he quit smoking. I counseled him to remain smoke free, and to contact me if he ever had the desire to smoke again so that I can provide resources and tools to help support the effort to remain smoke free. We discussed the time and location of the scan, and that either  Abigail Miyamoto RN or I will call with the results within  24-48 hours of receiving them. He has my card and contact information in the event he needs to speak with me, in addition to a copy of the power point we reviewed as a resource. He verbalized understanding of all of the above and had no further questions upon leaving the office.     I explained to the patient that there has been a high  incidence of coronary artery disease noted on these exams. I explained that this is a non-gated exam therefore degree or severity cannot be determined.  This patient is on statin therapy. I have asked the patient to follow-up with their PCP regarding any incidental finding of coronary artery disease and management with diet or medication as they feel is clinically indicated. The patient verbalized understanding of the above and had no further questions.   I spent 3 minutes counseling on smoking cessation and the health risks of continued tobacco abuse    Everard Interrante D. Tiburcio Pea, NP-C [facility] Personal contact information can be found on Amion  03/09/2022, 8:39 AM

## 2022-03-09 NOTE — Patient Instructions (Signed)
Thank you for participating in the [facility] Screening [facility]. It was our pleasure to meet you today. We will call you with the results of your scan within the next few days. Your scan will be assigned a Lung RADS category score by the physicians reading the scans.  This Lung RADS score determines follow up scanning.  See below for description of categories, and follow up screening recommendations. We will be in touch to schedule your follow up screening annually or based on recommendations of our providers. We will fax a copy of your scan results to your Primary Care Physician, or the physician who referred you to the program, to ensure they have the results. Please call the office if you have any questions or concerns regarding your scanning experience or results.  Our office number is [phone]. Please speak with Denise Phelps, RN. , or  Denise Buckner RN, They are  our Lung Cancer Screening RN.'s If They are unavailable when you call, Please leave a message on the voice mail. We will return your call at our earliest convenience.This voice mail is monitored several times a day.  Remember, if your scan is normal, we will scan you annually as long as you continue to meet the criteria for the program. (Age 55-77, Current smoker or smoker who has quit within the last 15 years). If you are a smoker, remember, quitting is the single most powerful action that you can take to decrease your risk of lung cancer and other pulmonary, breathing related problems. We know quitting is hard, and we are here to help.  Please let us know if there is anything we can do to help you meet your goal of quitting. If you are a former smoker, congratulations. We are proud of you! Remain smoke free! Remember you can refer friends or family members through the number above.  We will screen them to make sure they meet criteria for the program. Thank you for helping us take better care of you by  participating in Lung Screening.  You can receive free nicotine replacement therapy ( patches, gum or mints) by calling 1-800-QUIT NOW. Please call so we can get you on the path to becoming  a non-smoker. I know it is hard, but you can do this!  Lung RADS Categories:  Lung RADS 1: no nodules or definitely non-concerning nodules.  Recommendation is for a repeat annual scan in 12 months.  Lung RADS 2:  nodules that are non-concerning in appearance and behavior with a very low likelihood of becoming an active cancer. Recommendation is for a repeat annual scan in 12 months.  Lung RADS 3: nodules that are probably non-concerning , includes nodules with a low likelihood of becoming an active cancer.  Recommendation is for a 6-month repeat screening scan. Often noted after an upper respiratory illness. We will be in touch to make sure you have no questions, and to schedule your 6-month scan.  Lung RADS 4 A: nodules with concerning findings, recommendation is most often for a follow up scan in 3 months or additional testing based on our provider's assessment of the scan. We will be in touch to make sure you have no questions and to schedule the recommended 3 month follow up scan.  Lung RADS 4 B:  indicates findings that are concerning. We will be in touch with you to schedule additional diagnostic testing based on our provider's  assessment of the scan.  Other options for assistance in smoking cessation (   As covered by your insurance benefits)  Hypnosis for smoking cessation  [employer] [phone]  Acupuncture for smoking cessation  [facility] [phone]   

## 2022-03-10 ENCOUNTER — Ambulatory Visit (HOSPITAL_BASED_OUTPATIENT_CLINIC_OR_DEPARTMENT_OTHER): Payer: Commercial Managed Care - HMO

## 2022-03-12 ENCOUNTER — Telehealth: Payer: Self-pay | Admitting: Acute Care

## 2022-03-12 NOTE — Telephone Encounter (Signed)
Left message for pt to call back  °

## 2022-03-12 NOTE — Telephone Encounter (Signed)
Spoke with pt regarding lung screening CT. Pt completed Shared decision visit on 03/09/22. CT referral notes say that [facility] was supposed to call pt and reschedule his lung screening CT for after 03/09/22. Pt states that he has not heard from them. I gave pt the number for [facility] and also the Auth # that we received for the CT. Pt will call [facility] to get rescheduled. He has our call back if anything further is needed.

## 2022-05-03 ENCOUNTER — Ambulatory Visit (INDEPENDENT_AMBULATORY_CARE_PROVIDER_SITE_OTHER): Payer: Commercial Managed Care - HMO | Admitting: Family Medicine

## 2022-05-03 ENCOUNTER — Encounter: Payer: Self-pay | Admitting: Family Medicine

## 2022-05-03 ENCOUNTER — Ambulatory Visit (HOSPITAL_BASED_OUTPATIENT_CLINIC_OR_DEPARTMENT_OTHER)
Admission: RE | Admit: 2022-05-03 | Discharge: 2022-05-03 | Disposition: A | Discharge status: Home / Self Care | Payer: Commercial Managed Care - HMO | Source: Ambulatory Visit | Attending: Family Medicine | Admitting: Family Medicine

## 2022-05-03 VITALS — BP 122/78 | HR 73 | Temp 98.1°F | Resp 12 | Ht 65.0 in | Wt 122.2 lb

## 2022-05-03 DIAGNOSIS — M5416 Radiculopathy, lumbar region: Secondary | ICD-10-CM | POA: Insufficient documentation

## 2022-05-03 DIAGNOSIS — M545 Low back pain, unspecified: Secondary | ICD-10-CM

## 2022-05-03 MED ORDER — CYCLOBENZAPRINE HCL 10 MG PO TABS: 10.0000 mg | ORAL_TABLET | Freq: Three times a day (TID) | ORAL | 0 refills | Status: DC | PRN

## 2022-05-03 MED ORDER — PREDNISONE 10 MG PO TABS: ORAL_TABLET | ORAL | 0 refills | Status: DC

## 2022-05-03 MED ORDER — MELOXICAM 7.5 MG PO TABS: 7.5000 mg | ORAL_TABLET | Freq: Every day | ORAL | 0 refills | Status: DC
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
## 2022-05-03 NOTE — Assessment & Plan Note (Signed)
Muscle relaxer and pred taper  Moist heat  xrays today due to previous surgery F/u prn

## 2022-05-03 NOTE — Patient Instructions (Signed)
Low Back Sprain or Strain Rehab Ask your health care provider which exercises are safe for you. Do exercises exactly as told by your health care provider and adjust them as directed. It is normal to feel mild stretching, pulling, tightness, or discomfort as you do these exercises. Stop right away if you feel sudden pain or your pain gets worse. Do not begin these exercises until told by your health care provider. Stretching and range-of-motion exercises These exercises warm up your muscles and joints and improve the movement and flexibility of your back. These exercises also help to relieve pain, numbness, and tingling. Lumbar rotation  Lie on your back on a firm bed or the floor with your knees bent. Straighten your arms out to your sides so each arm forms a 90-degree angle (right angle) with a side of your body. Slowly move (rotate) both of your knees to one side of your body until you feel a stretch in your lower back (lumbar). Try not to let your shoulders lift off the floor. Hold this position for __________ seconds. Tense your abdominal muscles and slowly move your knees back to the starting position. Repeat this exercise on the other side of your body. Repeat __________ times. Complete this exercise __________ times a day. Single knee to chest  Lie on your back on a firm bed or the floor with both legs straight. Bend one of your knees. Use your hands to move your knee up toward your chest until you feel a gentle stretch in your lower back and buttock. Hold your leg in this position by holding on to the front of your knee. Keep your other leg as straight as possible. Hold this position for __________ seconds. Slowly return to the starting position. Repeat with your other leg. Repeat __________ times. Complete this exercise __________ times a day. Prone extension on elbows  Lie on your abdomen on a firm bed or the floor (prone position). Prop yourself up on your elbows. Use your arms  to help lift your chest up until you feel a gentle stretch in your abdomen and your lower back. This will place some of your body weight on your elbows. If this is uncomfortable, try stacking pillows under your chest. Your hips should stay down, against the surface that you are lying on. Keep your hip and back muscles relaxed. Hold this position for __________ seconds. Slowly relax your upper body and return to the starting position. Repeat __________ times. Complete this exercise __________ times a day. Strengthening exercises These exercises build strength and endurance in your back. Endurance is the ability to use your muscles for a long time, even after they get tired. Pelvic tilt This exercise strengthens the muscles that lie deep in the abdomen. Lie on your back on a firm bed or the floor with your legs extended. Bend your knees so they are pointing toward the ceiling and your feet are flat on the floor. Tighten your lower abdominal muscles to press your lower back against the floor. This motion will tilt your pelvis so your tailbone points up toward the ceiling instead of pointing to your feet or the floor. To help with this exercise, you may place a small towel under your lower back and try to push your back into the towel. Hold this position for __________ seconds. Let your muscles relax completely before you repeat this exercise. Repeat __________ times. Complete this exercise __________ times a day. Alternating arm and leg raises  Get on your hands   and knees on a firm surface. If you are on a hard floor, you may want to use padding, such as an exercise mat, to cushion your knees. Line up your arms and legs. Your hands should be directly below your shoulders, and your knees should be directly below your hips. Lift your left leg behind you. At the same time, raise your right arm and straighten it in front of you. Do not lift your leg higher than your hip. Do not lift your arm higher  than your shoulder. Keep your abdominal and back muscles tight. Keep your hips facing the ground. Do not arch your back. Keep your balance carefully, and do not hold your breath. Hold this position for __________ seconds. Slowly return to the starting position. Repeat with your right leg and your left arm. Repeat __________ times. Complete this exercise __________ times a day. Abdominal set with straight leg raise  Lie on your back on a firm bed or the floor. Bend one of your knees and keep your other leg straight. Tense your abdominal muscles and lift your straight leg up, 4-6 inches (10-15 cm) off the ground. Keep your abdominal muscles tight and hold this position for __________ seconds. Do not hold your breath. Do not arch your back. Keep it flat against the ground. Keep your abdominal muscles tense as you slowly lower your leg back to the starting position. Repeat with your other leg. Repeat __________ times. Complete this exercise __________ times a day. Single leg lower with bent knees Lie on your back on a firm bed or the floor. Tense your abdominal muscles and lift your feet off the floor, one foot at a time, so your knees and hips are bent in 90-degree angles (right angles). Your knees should be over your hips and your lower legs should be parallel to the floor. Keeping your abdominal muscles tense and your knee bent, slowly lower one of your legs so your toe touches the ground. Lift your leg back up to return to the starting position. Do not hold your breath. Do not let your back arch. Keep your back flat against the ground. Repeat with your other leg. Repeat __________ times. Complete this exercise __________ times a day. Posture and body mechanics Good posture and healthy body mechanics can help to relieve stress in your body's tissues and joints. Body mechanics refers to the movements and positions of your body while you do your daily activities. Posture is part of body  mechanics. Good posture means: Your spine is in its natural S-curve position (neutral). Your shoulders are pulled back slightly. Your head is not tipped forward (neutral). Follow these guidelines to improve your posture and body mechanics in your everyday activities. Standing  When standing, keep your spine neutral and your feet about hip-width apart. Keep a slight bend in your knees. Your ears, shoulders, and hips should line up. When you do a task in which you stand in one place for a long time, place one foot up on a stable object that is 2-4 inches (5-10 cm) high, such as a footstool. This helps keep your spine neutral. Sitting  When sitting, keep your spine neutral and keep your feet flat on the floor. Use a footrest, if necessary, and keep your thighs parallel to the floor. Avoid rounding your shoulders, and avoid tilting your head forward. When working at a desk or a computer, keep your desk at a height where your hands are slightly lower than your elbows. Slide your   chair under your desk so you are close enough to maintain good posture. When working at a computer, place your monitor at a height where you are looking straight ahead and you do not have to tilt your head forward or downward to look at the screen. Resting When lying down and resting, avoid positions that are most painful for you. If you have pain with activities such as sitting, bending, stooping, or squatting, lie in a position in which your body does not bend very much. For example, avoid curling up on your side with your arms and knees near your chest (fetal position). If you have pain with activities such as standing for a long time or reaching with your arms, lie with your spine in a neutral position and bend your knees slightly. Try the following positions: Lying on your side with a pillow between your knees. Lying on your back with a pillow under your knees. Lifting  When lifting objects, keep your feet at least  shoulder-width apart and tighten your abdominal muscles. Bend your knees and hips and keep your spine neutral. It is important to lift using the strength of your legs, not your back. Do not lock your knees straight out. Always ask for help to lift heavy or awkward objects. This information is not intended to replace advice given to you by your health care provider. Make sure you discuss any questions you have with your health care provider. Document Revised: 09/22/2020 Document Reviewed: 09/22/2020 Elsevier Patient Education  2023 [employer]  

## 2022-05-17 ENCOUNTER — Other Ambulatory Visit: Payer: Self-pay

## 2022-05-17 ENCOUNTER — Telehealth: Payer: Self-pay

## 2022-05-17 DIAGNOSIS — M542 Cervicalgia: Secondary | ICD-10-CM

## 2022-05-17 DIAGNOSIS — M545 Low back pain, unspecified: Secondary | ICD-10-CM

## 2022-05-17 NOTE — Telephone Encounter (Signed)
Patient advised of radiology results from 10-16. He reports he is still having neck and low back pain. Will like to know what is the next step. Medication helping some but not much.

## 2022-05-17 NOTE — Telephone Encounter (Signed)
Patient advised of referral, he agrees with plan. Referral entered

## 2022-05-30 ENCOUNTER — Other Ambulatory Visit: Payer: Self-pay | Admitting: Family Medicine

## 2022-05-30 DIAGNOSIS — M545 Low back pain, unspecified: Secondary | ICD-10-CM
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
## 2022-06-21 ENCOUNTER — Ambulatory Visit (INDEPENDENT_AMBULATORY_CARE_PROVIDER_SITE_OTHER): Payer: Commercial Managed Care - HMO

## 2022-06-21 ENCOUNTER — Ambulatory Visit (INDEPENDENT_AMBULATORY_CARE_PROVIDER_SITE_OTHER): Payer: Commercial Managed Care - HMO | Admitting: Family Medicine

## 2022-06-21 ENCOUNTER — Encounter: Payer: Self-pay | Admitting: Family Medicine

## 2022-06-21 VITALS — BP 102/68 | HR 80 | Ht 65.0 in | Wt 125.0 lb

## 2022-06-21 DIAGNOSIS — M545 Low back pain, unspecified: Secondary | ICD-10-CM | POA: Diagnosis not present

## 2022-06-21 DIAGNOSIS — M542 Cervicalgia: Secondary | ICD-10-CM

## 2022-06-21 DIAGNOSIS — M5416 Radiculopathy, lumbar region: Secondary | ICD-10-CM

## 2022-06-21 DIAGNOSIS — M5010 Cervical disc disorder with radiculopathy, unspecified cervical region: Secondary | ICD-10-CM | POA: Insufficient documentation

## 2022-06-21 MED ORDER — KETOROLAC TROMETHAMINE 60 MG/2ML IM SOLN
60.0000 mg | Freq: Once | INTRAMUSCULAR | Status: AC
  Administered 2022-06-21: 60 mg via INTRAMUSCULAR

## 2022-06-21 MED ORDER — GABAPENTIN 100 MG PO CAPS: 200.0000 mg | ORAL_CAPSULE | Freq: Every day | ORAL | 0 refills | Status: DC

## 2022-06-21 MED ORDER — METHYLPREDNISOLONE ACETATE 80 MG/ML IJ SUSP
80.0000 mg | Freq: Once | INTRAMUSCULAR | Status: AC
  Administered 2022-06-21: 80 mg via INTRAMUSCULAR
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
## 2022-06-21 NOTE — Patient Instructions (Addendum)
Xray today Gabapentin 200mg  at night Injections in backside today MRI cervical and lumbar [phone] We will be in touch with results

## 2022-07-03 ENCOUNTER — Ambulatory Visit
Admission: RE | Admit: 2022-07-03 | Discharge: 2022-07-03 | Disposition: A | Discharge status: Home / Self Care | Payer: Commercial Managed Care - HMO | Source: Ambulatory Visit | Attending: Family Medicine | Admitting: Family Medicine

## 2022-07-03 ENCOUNTER — Other Ambulatory Visit: Payer: Commercial Managed Care - HMO

## 2022-07-03 DIAGNOSIS — M542 Cervicalgia: Secondary | ICD-10-CM

## 2022-07-05 ENCOUNTER — Other Ambulatory Visit: Payer: Self-pay

## 2022-07-05 ENCOUNTER — Encounter: Payer: Self-pay | Admitting: Family Medicine

## 2022-07-05 DIAGNOSIS — M5412 Radiculopathy, cervical region: Secondary | ICD-10-CM

## 2022-07-05 NOTE — Telephone Encounter (Signed)
Left message for patient to call back. MRI lumbar denied due to negative lumbar xray and patient needs to do 12 weeks of conservative therapy ie. Physical therapy.

## 2022-07-06 ENCOUNTER — Other Ambulatory Visit: Payer: Self-pay | Admitting: Family Medicine

## 2022-07-06 DIAGNOSIS — M545 Low back pain, unspecified: Secondary | ICD-10-CM

## 2022-07-20 ENCOUNTER — Ambulatory Visit
Admission: RE | Admit: 2022-07-20 | Discharge: 2022-07-20 | Disposition: A | Discharge status: Home / Self Care | Payer: Commercial Managed Care - HMO | Source: Ambulatory Visit | Attending: Family Medicine | Admitting: Family Medicine

## 2022-07-20 DIAGNOSIS — M545 Low back pain, unspecified: Secondary | ICD-10-CM

## 2022-07-26 ENCOUNTER — Other Ambulatory Visit: Payer: Commercial Managed Care - HMO

## 2022-08-06 ENCOUNTER — Ambulatory Visit
Admission: RE | Admit: 2022-08-06 | Discharge: 2022-08-06 | Disposition: A | Discharge status: Home / Self Care | Payer: Commercial Managed Care - HMO | Source: Ambulatory Visit | Attending: Family Medicine | Admitting: Family Medicine

## 2022-08-06 DIAGNOSIS — M5412 Radiculopathy, cervical region: Secondary | ICD-10-CM

## 2022-08-06 MED ORDER — TRIAMCINOLONE ACETONIDE 40 MG/ML IJ SUSP (RADIOLOGY)
60.0000 mg | Freq: Once | INTRAMUSCULAR | Status: AC
  Administered 2022-08-06: 60 mg via EPIDURAL

## 2022-08-06 MED ORDER — IOPAMIDOL (ISOVUE-M 300) INJECTION 61%
1.0000 mL | Freq: Once | INTRAMUSCULAR | Status: AC
  Administered 2022-08-06: 1 mL via EPIDURAL

## 2022-08-06 NOTE — Discharge Instructions (Signed)
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
## 2022-08-13 ENCOUNTER — Ambulatory Visit: Payer: Commercial Managed Care - HMO | Admitting: Family Medicine

## 2022-08-13 VITALS — BP 124/82 | HR 90 | Ht 65.0 in | Wt 127.0 lb

## 2022-08-13 DIAGNOSIS — M25511 Pain in right shoulder: Secondary | ICD-10-CM | POA: Diagnosis not present

## 2022-08-13 DIAGNOSIS — M5416 Radiculopathy, lumbar region: Secondary | ICD-10-CM | POA: Diagnosis not present

## 2022-08-13 DIAGNOSIS — M5010 Cervical disc disorder with radiculopathy, unspecified cervical region: Secondary | ICD-10-CM

## 2022-08-13 NOTE — Assessment & Plan Note (Signed)
Injections today and tolerated the procedure well, discussed icing regimen and home exercises, discussed which activities to the discussed which activities to do and which ones to avoid.  Increase activity slowly.  Increase activity as tolerated.  Discussed that there is underlying arthritic changes that we will need to continue to monitor.  Discussed side effects of the possibility of trigger point injections.  Follow-up again in 6 weeks

## 2022-08-13 NOTE — Assessment & Plan Note (Signed)
Still has tightness noted. Responded relatively well though to osteopathic manipulation.  Because of patient's pain being out of proportion we will get laboratory workup to further evaluate if patient is having other difficulty.  Increase activity slowly.  Follow-up with me again 6 to 8 weeks

## 2022-08-13 NOTE — Patient Instructions (Addendum)
Trigger point injections today See you again in 4 weeks

## 2022-08-13 NOTE — Assessment & Plan Note (Signed)
Known arthritic changes.  Attempted trigger point injections to see if that will be more helpful.  May consider Cymbalta but patient does have significant other comorbidities including smoking that is essential determinant of health.  Will get other labs to rule out any other inflammatory markers that could be contributing as well with pain being out of proportion.  Follow-up again in 6 to 8 weeks.  Patient was put on lifting restrictions.

## 2022-09-09 NOTE — Progress Notes (Signed)
Benjamin Johnson [address] [place] Phone: [phone] Subjective:   IVilma Johnson, am serving as a scribe for Dr. Hulan Saas.  I'm seeing this patient by the request  of:  Terrilyn Saver, NP  CC: Back pain and neck pain follow-up  [id number]  08/13/2022 Injections today and tolerated the procedure well, discussed icing regimen and home exercises, discussed which activities to the discussed which activities to do and which ones to avoid.  Increase activity slowly.  Increase activity as tolerated.  Discussed that there is underlying arthritic changes that we will need to continue to monitor.  Discussed side effects of the possibility of trigger point injections.  Follow-up again in 6 weeks     Known arthritic changes.  Attempted trigger point injections to see if that will be more helpful.  May consider Cymbalta but patient does have significant other comorbidities including smoking that is essential determinant of health.  Will get other labs to rule out any other inflammatory markers that could be contributing as well with pain being out of proportion.  Follow-up again in 6 to 8 weeks.  Patient was put on lifting restrictions.     Still has tightness noted. Responded relatively well though to osteopathic manipulation.  Because of patient's pain being out of proportion we will get laboratory workup to further evaluate if patient is having other difficulty.  Increase activity slowly.  Follow-up with me again 6 to 8 weeks    Update 09/10/2022 Benjamin Johnson is a 62 y.o. male coming in with complaint of R shoulder, lumbar spine, and neck pain. Patient states neck pain is improved after the epidural.  Has noticed some improvement also with the meloxicam and gabapentin.  Still though has discomfort in the back that does stop him from activity sometimes.  Looking to continue to be active but finding it difficult.        Past Medical  History:  Diagnosis Date   Allergy    Arthritis    History of gallstones 07/20/1979   Past Surgical History:  Procedure Laterality Date   ERCP  07/20/1979   KNEE SURGERY Left 2022   LUMBAR DISC SURGERY  07/20/1987   Social History   Socioeconomic History   Marital status: Married    Spouse name: Not on file   Number of children: Not on file   Years of education: Not on file   Highest education level: Not on file  Occupational History   Not on file  Tobacco Use   Smoking status: Every Day    Packs/day: 0.30    Years: 36.00    Total pack years: 10.80    Types: Cigarettes   Smokeless tobacco: Never   Tobacco comments:    Reports he has cut back to 3-4 per day with use of nicotine patches   Substance and Sexual Activity   Alcohol use: Yes    Alcohol/week: 6.0 standard drinks of alcohol    Types: 6 Cans of beer per week   Drug use: No   Sexual activity: Yes  Other Topics Concern   Not on file  Social History Narrative   Married since age 63   Twin girls (age 26) one is an Catering manager.  Grandson lives with them (age 3). 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 grade      Social Determinants of Health   Financial Resource Strain: Not on file  Food Insecurity: Not on file  Transportation Needs: Not on file  Physical Activity: Not on file  Stress: Not on file  Social Connections: Not on file   No Known Allergies Family History  Problem Relation Age of Onset   Cancer Father 61       colon   Hyperlipidemia Neg Hx    Hypertension Neg Hx    Diabetes Neg Hx     Current Outpatient Medications (Endocrine & Metabolic):    predniSONE (DELTASONE) 10 MG tablet, TAKE 3 TABLETS PO QD FOR 3 DAYS THEN TAKE 2 TABLETS PO QD FOR 3 DAYS THEN TAKE 1 TABLET PO QD FOR 3 DAYS THEN TAKE 1/2 TAB PO QD FOR 3 DAYS  Current Outpatient Medications (Cardiovascular):    atorvastatin  (LIPITOR) 20 MG tablet, Take 1 tablet (20 mg total) by mouth daily.  Current Outpatient Medications (Respiratory):    azelastine (ASTELIN) 0.1 % nasal spray, Place 2 sprays into both nostrils 2 (two) times daily. Use in each nostril as directed  Current Outpatient Medications (Analgesics):    meloxicam (MOBIC) 7.5 MG tablet, TAKE 1 TABLET(7.5 MG) BY MOUTH DAILY. DO NOT. START UNTIL PREDNISONE IS FINISHED   Current Outpatient Medications (Other):    cyclobenzaprine (FLEXERIL) 10 MG tablet, Take 1 tablet (10 mg total) by mouth 3 (three) times daily as needed for muscle spasms.   gabapentin (NEURONTIN) 100 MG capsule, Take 2 capsules (200 mg total) by mouth at bedtime.   nicotine (NICODERM CQ - DOSED IN MG/24 HOURS) 21 mg/24hr patch, Place 1 patch (21 mg total) onto the skin daily.   Reviewed prior external information including notes and imaging from  primary care provider As well as notes that were available from care everywhere and other healthcare systems.  Past medical history, social, surgical and family history all reviewed in electronic medical record.  No pertanent information unless stated regarding to the chief complaint.   Review of Systems:  No headache, visual changes, nausea, vomiting, diarrhea, constipation, dizziness, abdominal pain, skin rash, fevers, chills, night sweats, weight loss, swollen lymph nodes, body aches, joint swelling, chest pain, shortness of breath, mood changes. POSITIVE muscle aches  Objective  Height '5\' 5"'$  (1.651 m).   General: No apparent distress alert and oriented x3 mood and affect normal, dressed appropriately.  HEENT: Pupils equal, extraocular movements intact  Respiratory: Patient's speak in full sentences and does not appear short of breath  Cardiovascular: No lower extremity edema, non tender, no erythema  Neck exam does have significant loss of lordosis.  Some tenderness to palpation in the paraspinal musculature.  Patient does have  tightness noted in the lumbar spine from L2-L5 bilaterally.  Does lack the last 10 degrees of extension of the back.  Osteopathic findings C2 flexed rotated and side bent right C4 flexed rotated and side bent left C6 flexed rotated and side bent left T3 extended rotated and side bent right inhaled third rib T9 extended rotated and side bent left L2 flexed rotated and side bent right L4 flexed rotated and side bent right Sacrum right on right     Impression and Recommendations:     Lumbar radiculopathy, right Still significant tightness of the back noted but we did attempt osteopathic manipulation.  Patient was able to ambulate better after the osteopathic manipulation.  Does have underlying arthritic changes and still concern for some nerve irritation.  Discussed gabapentin.  Increase activity slowly.  Follow-up again in 6 to 8 weeks  Somatic dysfunction of spine, lumbar   Decision today to treat with OMT was based on Physical Exam  After verbal consent patient was treated with HVLA, ME, FPR techniques in cervical, thoracic, rib, lumbar and sacral areas, all areas are chronic   Patient tolerated the procedure well with improvement in symptoms  Patient given exercises, stretches and lifestyle modifications  See medications in patient instructions if given  Patient will follow up in 4-8 weeks  Cervical disc disorder with radiculopathy of cervical region Chronic problem but did respond extremely well to epidural that was done in the outpatient setting.  Some mild improvement in range of motion and attempted osteopathic manipulation today.  Continue the gabapentin.  Follow-up again in 6 to 8 weeks

## 2022-09-10 ENCOUNTER — Encounter: Payer: Self-pay | Admitting: Family Medicine

## 2022-09-10 ENCOUNTER — Ambulatory Visit (INDEPENDENT_AMBULATORY_CARE_PROVIDER_SITE_OTHER): Payer: Commercial Managed Care - HMO | Admitting: Family Medicine

## 2022-09-10 VITALS — Ht 65.0 in

## 2022-09-10 DIAGNOSIS — M9903 Segmental and somatic dysfunction of lumbar region: Secondary | ICD-10-CM

## 2022-09-10 DIAGNOSIS — M9904 Segmental and somatic dysfunction of sacral region: Secondary | ICD-10-CM | POA: Diagnosis not present

## 2022-09-10 DIAGNOSIS — M5010 Cervical disc disorder with radiculopathy, unspecified cervical region: Secondary | ICD-10-CM | POA: Diagnosis not present

## 2022-09-10 DIAGNOSIS — M5416 Radiculopathy, lumbar region: Secondary | ICD-10-CM

## 2022-09-10 DIAGNOSIS — M9908 Segmental and somatic dysfunction of rib cage: Secondary | ICD-10-CM

## 2022-09-10 DIAGNOSIS — M9901 Segmental and somatic dysfunction of cervical region: Secondary | ICD-10-CM | POA: Diagnosis not present

## 2022-09-10 DIAGNOSIS — M9902 Segmental and somatic dysfunction of thoracic region: Secondary | ICD-10-CM

## 2022-09-10 NOTE — Patient Instructions (Signed)
Scapular exercises Glad we are making improvement See me in 6 weeks

## 2022-09-10 NOTE — Assessment & Plan Note (Signed)
Chronic problem but did respond extremely well to epidural that was done in the outpatient setting.  Some mild improvement in range of motion and attempted osteopathic manipulation today.  Continue the gabapentin.  Follow-up again in 6 to 8 weeks

## 2022-09-10 NOTE — Assessment & Plan Note (Signed)
Still significant tightness of the back noted but we did attempt osteopathic manipulation.  Patient was able to ambulate better after the osteopathic manipulation.  Does have underlying arthritic changes and still concern for some nerve irritation.  Discussed gabapentin.  Increase activity slowly.  Follow-up again in 6 to 8 weeks

## 2022-09-10 NOTE — Assessment & Plan Note (Signed)

## 2022-09-18 ENCOUNTER — Other Ambulatory Visit: Payer: Self-pay | Admitting: Family Medicine

## 2022-10-20 NOTE — Progress Notes (Unsigned)
Benjamin ScaleZach Benjamin Johnson D.O. [facility] [address] [place] [id number] Phone: [phone] Subjective:   [name], [name], am serving as a scribe for Dr. Antoine PrimasZachary Ronda Johnson.  I'm seeing this patient by the request  of:  Benjamin DanaBeck, Benjamin B, NP  CC: back and neck pain   Benjamin Johnson:[id number]:Subjective  Benjamin LoboWilbert Johnson is a 62 y.o. male coming in with complaint of back and neck pain. OMT on 09/10/2022. Patient states that he does have intermittent pain in lower back. Less pain in the shoulder as well. Increase in ROM.  Low back continues to have some discomfort as well.  Neck exam does have some still limited range of motion but overall continues to improve          Reviewed prior external information including notes and imaging from previsou exam, outside providers and external EMR if available.   As well as notes that were available from care everywhere and other healthcare systems.  Past medical history, social, surgical and family history all reviewed in electronic medical record.  No pertanent information unless stated regarding to the chief complaint.   Past Medical History:  Diagnosis Date   Allergy    Arthritis    History of gallstones 07/20/1979    No Known Allergies   Review of Systems:  No headache, visual changes, nausea, vomiting, diarrhea, constipation, dizziness, abdominal pain, skin rash, fevers, chills, night sweats, weight loss, swollen lymph nodes, body aches, joint swelling, chest pain, shortness of breath, mood changes. POSITIVE muscle aches  Objective  Blood pressure 102/74, pulse 92, height 5\' 5"  (1.651 m), weight 125 lb (56.7 kg), SpO2 94 %.   General: No apparent distress alert and oriented x3 mood and affect normal, dressed appropriately.  HEENT: Pupils equal, extraocular movements intact  Respiratory: Patient's speak in full sentences and does not appear short of breath  Cardiovascular: No lower extremity edema, non tender, no erythema  Low back does have some loss  of lordosis.  Tightness with FABER test bilaterally.  Tightness with straight leg test as well.  No significant radicular symptoms are noted with straight leg test.  Patient's neck exam does have some negative Spurling's  Osteopathic findings  C2 flexed rotated and side bent right C6 flexed rotated and side bent left T7 extended rotated and side bent right inhaled rib T9 extended rotated and side bent left L3 flexed rotated and side bent right Sacrum right on right       Assessment and Plan:  Lumbar radiculopathy, right Patient has made significant improvement at this time.  Do believe that patient is likely at maximal medical improvement after the injury.  Patient will have likely 0% or any disability.  There is a possibility though that 3-4 times in a year that that could be some exacerbations over the course of the next 1 to 2 years but then slowly will continue to improve and likely will not have any long-term ramifications of this individual.  Patient is doing well can follow-up with me on an as-needed basis    Nonallopathic problems  Decision today to treat with OMT was based on Physical Exam  After verbal consent patient was treated with HVLA, ME, FPR techniques in  thoracic, lumbar, and sacral  areas  Patient tolerated the procedure well with improvement in symptoms  Patient given exercises, stretches and lifestyle modifications  See medications in patient instructions if given  Patient will follow up in 4-8 weeks      The above documentation has  been reviewed and is accurate and complete Benjamin SaaZachary M Meridee Branum, DO        Note: This dictation was prepared with Dragon dictation along with smaller phrase technology. Any transcriptional errors that result from this process are unintentional.

## 2022-10-25 ENCOUNTER — Ambulatory Visit (INDEPENDENT_AMBULATORY_CARE_PROVIDER_SITE_OTHER): Payer: Commercial Managed Care - HMO | Admitting: Family Medicine

## 2022-10-25 VITALS — BP 102/74 | HR 92 | Ht 65.0 in | Wt 125.0 lb

## 2022-10-25 DIAGNOSIS — M9903 Segmental and somatic dysfunction of lumbar region: Secondary | ICD-10-CM

## 2022-10-25 DIAGNOSIS — M9902 Segmental and somatic dysfunction of thoracic region: Secondary | ICD-10-CM | POA: Diagnosis not present

## 2022-10-25 DIAGNOSIS — M9904 Segmental and somatic dysfunction of sacral region: Secondary | ICD-10-CM

## 2022-10-25 DIAGNOSIS — M9908 Segmental and somatic dysfunction of rib cage: Secondary | ICD-10-CM | POA: Diagnosis not present

## 2022-10-25 DIAGNOSIS — M5416 Radiculopathy, lumbar region: Secondary | ICD-10-CM | POA: Diagnosis not present

## 2022-10-25 DIAGNOSIS — M9901 Segmental and somatic dysfunction of cervical region: Secondary | ICD-10-CM | POA: Diagnosis not present

## 2022-10-25 NOTE — Patient Instructions (Signed)
Chance of 3-4 flares over next 2 years Can see me when you need me  Otherwise have reach maximal medical improvement

## 2022-10-26 ENCOUNTER — Encounter: Payer: Self-pay | Admitting: Family Medicine

## 2022-10-26 NOTE — Assessment & Plan Note (Signed)
Patient has made significant improvement at this time.  Do believe that patient is likely at maximal medical improvement after the injury.  Patient will have likely 0% or any disability.  There is a possibility though that 3-4 times in a year that that could be some exacerbations over the course of the next 1 to 2 years but then slowly will continue to improve and likely will not have any long-term ramifications of this individual.  Patient is doing well can follow-up with me on an as-needed basis

## 2022-11-01 ENCOUNTER — Encounter: Payer: Self-pay | Admitting: *Deleted

## 2022-11-19 ENCOUNTER — Ambulatory Visit: Payer: Commercial Managed Care - HMO | Admitting: Family Medicine

## 2024-05-14 ENCOUNTER — Ambulatory Visit (INDEPENDENT_AMBULATORY_CARE_PROVIDER_SITE_OTHER): Payer: Self-pay | Admitting: Family Medicine

## 2024-05-14 ENCOUNTER — Encounter: Payer: Self-pay | Admitting: Family Medicine

## 2024-05-14 VITALS — BP 123/81 | HR 78 | Ht 65.0 in | Wt 126.0 lb

## 2024-05-14 DIAGNOSIS — R350 Frequency of micturition: Secondary | ICD-10-CM | POA: Diagnosis not present

## 2024-05-14 DIAGNOSIS — Z720 Tobacco use: Secondary | ICD-10-CM | POA: Diagnosis not present

## 2024-05-14 DIAGNOSIS — Z23 Encounter for immunization: Secondary | ICD-10-CM | POA: Diagnosis not present

## 2024-05-14 DIAGNOSIS — M545 Low back pain, unspecified: Secondary | ICD-10-CM | POA: Diagnosis not present

## 2024-05-14 DIAGNOSIS — E782 Mixed hyperlipidemia: Secondary | ICD-10-CM | POA: Diagnosis not present

## 2024-05-14 DIAGNOSIS — Z0000 Encounter for general adult medical examination without abnormal findings: Secondary | ICD-10-CM

## 2024-05-14 MED ORDER — MELOXICAM 7.5 MG PO TABS: 7.5000 mg | ORAL_TABLET | Freq: Every day | ORAL | 0 refills | Status: DC

## 2024-05-14 MED ORDER — BUPROPION HCL ER (SR) 150 MG PO TB12: 150.0000 mg | ORAL_TABLET | Freq: Two times a day (BID) | ORAL | 1 refills | Status: AC

## 2024-05-14 NOTE — Assessment & Plan Note (Signed)
-   Check cholesterol levels. - Consider alternative medication if levels are high. Atorvastatin  previously cause GI upset; not taken in 2 years.  Lifestyle factors for lowering cholesterol include: Diet therapy - heart-healthy diet rich in fruits, veggies, fiber-rich whole grains, lean meats, chicken, fish (at least twice a week), fat-free or 1% dairy products; foods low in saturated/trans fats, cholesterol, sodium, and sugar. Mediterranean diet has shown to be very heart healthy. Regular exercise - recommend at least 30 minutes a day, 5 times per week Weight management

## 2024-05-14 NOTE — Progress Notes (Deleted)
 Complete physical exam  Patient: Benjamin Johnson   DOB: 03-21-1961   63 y.o. Male  MRN: [id number]  Subjective:    No chief complaint on file.   Benjamin Johnson is a 63 y.o. male who presents today for a complete physical exam. He reports consuming a {diet types:17450} diet. {types:19826} He generally feels {DESC; WELL/FAIRLY WELL/POORLY:18703}. He reports sleeping {DESC; WELL/FAIRLY WELL/POORLY:18703}. He {does/does not:200015} have additional problems to discuss today.   Currently lives with: *** Acute concerns or interim problems since last visit: ***  Vision concerns: *** Dental concerns: *** STD concerns: ***  ETOH use: *** Nicotine  use: *** Recreational drugs/illegal substances: ***  Lung Cancer Screening with low-dose Chest CT: *** - Adults age 60-80 who are current cigarette smokers or quit within the last 15 years. Must have 20 pack year history.  AAA Screening: *** - Men age 9-75 who have ever smoked PSA: - (ages 67-69)? The natural history of prostate cancer and ongoing controversy regarding screening and potential treatment outcomes of prostate cancer has been discussed with the patient. The meaning of a false positive PSA and a false negative PSA has been discussed. He indicates understanding of the limitations of this screening test and wishes *** to proceed with screening PSA testing.  Females:  She is {Blank single:19197::currently,not currently,never}  sexually active  Contraception choices are: *** LMP: ***      Most recent fall risk assessment:    05/03/2022   10:02 AM  Fall Risk   Falls in the past year? 0  Number falls in past yr: 0     Most recent depression screenings:    05/03/2022   10:02 AM  PHQ 2/9 Scores  PHQ - 2 Score 0          {History (Optional):23778}  Patient Care Team: Almarie Waddell NOVAK, NP as PCP - General (Family Medicine)   Outpatient Medications Prior to Visit  Medication Sig   atorvastatin  (LIPITOR) 20 MG  tablet Take 1 tablet (20 mg total) by mouth daily.   azelastine  (ASTELIN ) 0.1 % nasal spray Place 2 sprays into both nostrils 2 (two) times daily. Use in each nostril as directed   cyclobenzaprine  (FLEXERIL ) 10 MG tablet Take 1 tablet (10 mg total) by mouth 3 (three) times daily as needed for muscle spasms.   gabapentin  (NEURONTIN ) 100 MG capsule TAKE 2 CAPSULES(200 MG) BY MOUTH AT BEDTIME   meloxicam  (MOBIC ) 7.5 MG tablet TAKE 1 TABLET(7.5 MG) BY MOUTH DAILY. DO NOT. START UNTIL PREDNISONE  IS FINISHED   nicotine  (NICODERM CQ  - DOSED IN MG/24 HOURS) 21 mg/24hr patch Place 1 patch (21 mg total) onto the skin daily.   predniSONE  (DELTASONE ) 10 MG tablet TAKE 3 TABLETS PO QD FOR 3 DAYS THEN TAKE 2 TABLETS PO QD FOR 3 DAYS THEN TAKE 1 TABLET PO QD FOR 3 DAYS THEN TAKE 1/2 TAB PO QD FOR 3 DAYS   No facility-administered medications prior to visit.    ROS        Objective:     There were no vitals taken for this visit. {Vitals History (Optional):23777}  Physical Exam   No results found for any visits on 05/14/24. {Show previous labs (optional):23779}    Assessment & Plan:    Routine Health Maintenance and Physical Exam Discussed health promotion and safety including diet and exercise recommendations, dental health, and injury prevention. Tobacco cessation if applicable. Seat belts, sunscreen, smoke detectors, etc.    Immunization History  Administered Date(s) Administered  Fluad Trivalent(High Dose 65+) 04/30/2024   Influenza Inj Mdck Quad Pf 04/13/2022   Influenza,inj,Quad PF,6+ Mos 09/13/2019, 05/21/2021, 04/13/2022   PFIZER(Purple Top)SARS-COV-2 Vaccination 09/26/2019, 10/25/2019, 07/07/2020   Pfizer Covid-19 Vaccine Bivalent Booster 19yrs & up 05/21/2020, 05/21/2021, 04/13/2022   Pfizer(Comirnaty)Fall Seasonal Vaccine 12 years and older 04/13/2022   Tdap 09/13/2019   Zoster Recombinant(Shingrix) 04/30/2024    Health Maintenance  Topic Date Due   Medicare Annual Wellness  (AWV)  Never done   Pneumococcal Vaccine: 50+ Years (1 of 2 - PCV) Never done   COVID-19 Vaccine (7 - 2025-26 season) 05/27/2024 (Originally 03/19/2024)   Zoster Vaccines- Shingrix (2 of 2) 06/25/2024   DTaP/Tdap/Td (2 - Td or Tdap) 09/12/2029   Colonoscopy  12/17/2029   Influenza Vaccine  Completed   Hepatitis C Screening  Completed   HIV Screening  Completed   Hepatitis B Vaccines 19-59 Average Risk  Aged Out   Meningococcal B Vaccine  Aged Out        Problem List Items Addressed This Visit   None Visit Diagnoses       Preventative health care    -  Primary          PATIENT COUNSELING:   Advised to take 1 mg of folate supplement per day if capable of pregnancy. ***  Encouraged smoking cessation. ***  Recommend that most people either abstain from alcohol or drink within safe limits (<=14/week and <=4 drinks/occasion for males, <=7/weeks and <= 3 drinks/occasion for females) and that the risk for alcohol disorders and other health effects rises proportionally with the number of drinks per week and how often a drinker exceeds daily limits.   Diet: Recommend to adjust caloric intake to maintain or achieve ideal body weight, to reduce intake of dietary saturated fat and total fat, to limit sodium intake by avoiding high sodium foods and not adding table salt, and to maintain adequate dietary potassium and calcium  preferably from fresh fruits, vegetables, and low-fat dairy products.   Emphasized the importance of regular exercise.  Injury prevention: Recommend seatbelts, safety helmets, smoke detector, etc..   Dental health: Recommend regular tooth brushing, flossing, and dental visits.       No follow-ups on file.     Waddell KATHEE Mon, NP

## 2024-05-14 NOTE — Assessment & Plan Note (Signed)
 Smoking for 25 years, one pack per day. Ready to quit. Nicotine  patches ineffective. Discussed Wellbutrin and provided educational materials. - Prescribe Wellbutrin for smoking cessation. - New referral to lung cancer screening program.

## 2024-05-14 NOTE — Progress Notes (Signed)
 Established Patient Office Visit  Subjective   Patient ID: Benjamin Johnson, male    DOB: [birth year]  Age: 63 y.o. MRN: [id number]  Chief Complaint  Patient presents with   Medical Management of Chronic Issues    HPI   Discussed the use of AI scribe software for clinical note transcription with the patient, who gave verbal consent to proceed.  History of Present Illness Cashawn Yanko Will is a 63 year old male who presents for re-establishment of care and evaluation of urinary frequency.  He has experienced increased urinary frequency for approximately one year, waking up at night to urinate about three to four times and experiencing frequent urination in the morning. No burning, pain, or hematuria, but there is occasional difficulty initiating urination.  He has a 25-year history of smoking, currently smoking about one pack per day, and wants to quit smoking. Nicotine  patches have not been effective in the past.  He has a history of hyperlipidemia, previously noted in lab work. Atorvastatin  previously caused gastrointestinal upset. He has not been on medication in about 2 years due to lack of insurance.   He needs a refill of meloxicam  for chronic low back and knee pain.          ROS All review of systems negative except what is listed in the HPI    Objective:     BP 123/81   Pulse 78   Ht 5' 5 (1.651 m)   Wt 126 lb (57.2 kg)   SpO2 100%   BMI 20.97 kg/m    Physical Exam Vitals reviewed.  Constitutional:      General: He is not in acute distress.    Appearance: Normal appearance. He is normal weight. He is not ill-appearing.  Cardiovascular:     Rate and Rhythm: Normal rate and regular rhythm.     Heart sounds: Normal heart sounds.  Pulmonary:     Effort: Pulmonary effort is normal.     Breath sounds: Normal breath sounds.  Skin:    General: Skin is warm and dry.  Neurological:     Mental Status: He is alert and oriented to person, place, and time.   Psychiatric:        Mood and Affect: Mood normal.        Behavior: Behavior normal.        Thought Content: Thought content normal.        Judgment: Judgment normal.         No results found for any visits on 05/14/24.    The 10-year ASCVD risk score (Arnett DK, et al., 2019) is: 14.4%    Assessment & Plan:   Problem List Items Addressed This Visit       Active Problems   Tobacco abuse   Smoking for 25 years, one pack per day. Ready to quit. Nicotine  patches ineffective. Discussed Wellbutrin and provided educational materials. - Prescribe Wellbutrin for smoking cessation. - New referral to lung cancer screening program.      Relevant Medications   buPROPion (WELLBUTRIN SR) 150 MG 12 hr tablet   Other Relevant Orders   Ambulatory Referral Lung Cancer Screening [facility]   Mixed hyperlipidemia   - Check cholesterol levels. - Consider alternative medication if levels are high. Atorvastatin  previously cause GI upset; not taken in 2 years.  Lifestyle factors for lowering cholesterol include: Diet therapy - heart-healthy diet rich in fruits, veggies, fiber-rich whole grains, lean meats, chicken, fish (at least twice a week),  fat-free or 1% dairy products; foods low in saturated/trans fats, cholesterol, sodium, and sugar. Mediterranean diet has shown to be very heart healthy. Regular exercise - recommend at least 30 minutes a day, 5 times per week Weight management        Relevant Orders   Comprehensive metabolic panel with GFR   Lipid panel   Other Visit Diagnoses       Urinary frequency    -  Primary Increased frequency for a year, nocturia 3-4 times per night. No dysuria, hematuria, or weak stream. Occasional difficulty starting stream. Differential includes benign prostatic hyperplasia. - Order urinalysis. - Check PSA, A1c. - Consider referral to urologist if issues persist.   Relevant Orders   CBC with Differential/Platelet   Comprehensive metabolic  panel with GFR   TSH   PSA   Urinalysis w microscopic + reflex cultur   Hemoglobin A1c     Acute bilateral low back pain without sciatica     Refill provided. Patient to follow-up with sports medicine as needed.    Relevant Medications   meloxicam  (MOBIC ) 7.5 MG tablet     Immunization due       Relevant Orders   Pneumococcal conjugate vaccine 20-valent (Prevnar 20) (Completed)         Return in about 3 months (around 08/14/2024) for physical.    Waddell KATHEE Mon, NP

## 2024-05-15 LAB — URINALYSIS W MICROSCOPIC + REFLEX CULTURE
Bacteria, UA: NONE SEEN /HPF
Bilirubin Urine: NEGATIVE
Glucose, UA: NEGATIVE
Hyaline Cast: NONE SEEN /LPF
Ketones, ur: NEGATIVE
Leukocyte Esterase: NEGATIVE
Nitrites, Initial: NEGATIVE
Protein, ur: NEGATIVE
RBC / HPF: NONE SEEN /HPF (ref 0–2)
Specific Gravity, Urine: 1.01 (ref 1.001–1.035)
Squamous Epithelial / HPF: NONE SEEN /HPF (ref <=5)
WBC, UA: NONE SEEN /HPF (ref 0–5)
pH: 5.5 (ref 5.0–8.0)

## 2024-05-15 LAB — LIPID PANEL
Cholesterol: 214 mg/dL — ABNORMAL HIGH (ref 0–200)
HDL: 61.4 mg/dL (ref >39.00)
LDL Cholesterol: 131 mg/dL — ABNORMAL HIGH (ref 0–99)
NonHDL: 152.39
Total CHOL/HDL Ratio: 3
Triglycerides: 106 mg/dL (ref 0.0–149.0)
VLDL: 21.2 mg/dL (ref 0.0–40.0)

## 2024-05-15 LAB — CBC WITH DIFFERENTIAL/PLATELET
Basophils Absolute: 0.1 K/uL (ref 0.0–0.1)
Basophils Relative: 1.4 % (ref 0.0–3.0)
Eosinophils Absolute: 0.2 K/uL (ref 0.0–0.7)
Eosinophils Relative: 3 % (ref 0.0–5.0)
HCT: 44.2 % (ref 39.0–52.0)
Hemoglobin: 14.7 g/dL (ref 13.0–17.0)
Lymphocytes Relative: 38.9 % (ref 12.0–46.0)
Lymphs Abs: 2 K/uL (ref 0.7–4.0)
MCHC: 33.2 g/dL (ref 30.0–36.0)
MCV: 93.6 fl (ref 78.0–100.0)
Monocytes Absolute: 0.5 K/uL (ref 0.1–1.0)
Monocytes Relative: 9.7 % (ref 3.0–12.0)
Neutro Abs: 2.4 K/uL (ref 1.4–7.7)
Neutrophils Relative %: 47 % (ref 43.0–77.0)
Platelets: 212 K/uL (ref 150.0–400.0)
RBC: 4.72 Mil/uL (ref 4.22–5.81)
RDW: 13.8 % (ref 11.5–15.5)
WBC: 5.1 K/uL (ref 4.0–10.5)

## 2024-05-15 LAB — COMPREHENSIVE METABOLIC PANEL WITH GFR
ALT: 20 U/L (ref 0–53)
AST: 19 U/L (ref 0–37)
Albumin: 4.1 g/dL (ref 3.5–5.2)
Alkaline Phosphatase: 77 U/L (ref 39–117)
BUN: 13 mg/dL (ref 6–23)
CO2: 29 meq/L (ref 19–32)
Calcium: 9.7 mg/dL (ref 8.4–10.5)
Chloride: 104 meq/L (ref 96–112)
Creatinine, Ser: 1.01 mg/dL (ref 0.40–1.50)
GFR: 78.28 mL/min (ref >60.00)
Glucose, Bld: 96 mg/dL (ref 70–99)
Potassium: 4.1 meq/L (ref 3.5–5.1)
Sodium: 140 meq/L (ref 135–145)
Total Bilirubin: 0.3 mg/dL (ref 0.2–1.2)
Total Protein: 6.8 g/dL (ref 6.0–8.3)

## 2024-05-15 LAB — NO CULTURE INDICATED

## 2024-05-15 LAB — TSH: TSH: 1.83 u[IU]/mL (ref 0.35–5.50)

## 2024-05-15 LAB — PSA: PSA: 0.89 ng/mL (ref 0.10–4.00)

## 2024-05-15 LAB — HEMOGLOBIN A1C: Hgb A1c MFr Bld: 6.1 % (ref 4.6–6.5)

## 2024-05-20 ENCOUNTER — Ambulatory Visit: Payer: Self-pay | Admitting: Family Medicine

## 2024-05-20 DIAGNOSIS — E782 Mixed hyperlipidemia: Secondary | ICD-10-CM

## 2024-05-22 ENCOUNTER — Ambulatory Visit: Payer: Self-pay

## 2024-05-22 NOTE — Telephone Encounter (Signed)
 FYI Only or Action Required?: Action required by provider: clinical question for provider and question about ASCVD score.  Patient was last seen in primary care on 05/14/2024 by Almarie Waddell NOVAK, NP.  Called Nurse Triage reporting Results.   Triage Disposition: Call PCP Within 24 Hours  Patient/caregiver understands and will follow disposition?: Yes       Copied from CRM #[id number]. Topic: Clinical - Lab/Test Results >> May 22, 2024  8:23 AM Donna BRAVO wrote: Reason for CRM: patient returning call from Chiquita GORMAN Sayres, [facility] 05/22/2024  8:17 AM EST Back to Top  Patient called.  Left message for patient to call back.  Read verbatim note:  Waddell NOVAK Almarie, NP 05/20/2024  2:18 PM EST   Cholesterol is elevated. We can either continuing monitoring with just lifestyle measures, go ahead and start a cholesterol medication, or get a CT Coronary Calcium  test (scan of the heart that looks at plaque build up). If no plaque, we don't need to start a medication. If there is plaque, we start aspirin and a cholesterol lowering medication. The scan can cost between $99-150 out of pocket. Let me know what you would like to do.    A1c is still in prediabetes range - recommend low carb diet and regular exercise.      The 10-year ASCVD risk score (Arnett DK, et al., 2019) is: 14.9%   Values used to calculate the score:     Age: 63 years     Clincally relevant sex: Male     Is Non-Hispanic African American: Yes     Diabetic: No     Tobacco smoker: Yes     Systolic Blood Pressure: 123 mmHg     Is BP treated: No     HDL Cholesterol: 61.4 mg/dL     Total Cholesterol: 214 mg/dL >> May 22, 2024  1:66 AM Donna BRAVO wrote: Called [facility], who gave permissions to read note verbatim.  Patient has questions and would like to speak with a nurse. Reason for Disposition  [1] Follow-up call from patient regarding patient's clinical status AND [2] information NON-URGENT  Answer Assessment - Initial Assessment Questions 1.  REASON FOR CALL or QUESTION: What is your reason for calling today? or How can I best     Has additional questions re: lab results from 05/14/24. Triager reviewed PCP notes: Waddell NOVAK Almarie, NP 05/20/2024  2:18 PM EST   Cholesterol is elevated. We can either continuing monitoring with just lifestyle measures, go ahead and start a cholesterol medication, or get a CT Coronary Calcium  test (scan of the heart that looks at plaque build up). If no plaque, we don't need to start a medication. If there is plaque, we start aspirin and a cholesterol lowering medication. The scan can cost between $99-150 out of pocket. Let me know what you would like to do.    A1c is still in prediabetes range - recommend low carb diet and regular exercise.   The 10-year ASCVD risk score (Arnett DK, et al., 2019) is: 14.9%   2. CALLER: Document the source of call. (e.g., laboratory staff, caregiver or patient).     Patient Pt had additional questions re: test and insurance. Triager advised to call insurance for clarification and provided pt with test name. Pt will call PCP once he has decided on next steps.   Additionally, pt wants guidance from PCP if he should be concerned about his ASCVD score.  Protocols used: PCP Call - No Triage-A-AH

## 2024-05-25 MED ORDER — ROSUVASTATIN CALCIUM 5 MG PO TABS: 5.0000 mg | ORAL_TABLET | Freq: Every day | ORAL | 1 refills | Status: AC

## 2024-05-29 ENCOUNTER — Telehealth: Payer: Self-pay | Admitting: Acute Care

## 2024-05-29 DIAGNOSIS — F17210 Nicotine dependence, cigarettes, uncomplicated: Secondary | ICD-10-CM

## 2024-05-29 DIAGNOSIS — Z87891 Personal history of nicotine dependence: Secondary | ICD-10-CM

## 2024-05-29 DIAGNOSIS — Z122 Encounter for screening for malignant neoplasm of respiratory organs: Secondary | ICD-10-CM

## 2024-05-29 NOTE — Telephone Encounter (Signed)
 Lung Cancer Screening Narrative/Criteria Questionnaire (Cigarette Smokers Only- No Cigars/Pipes/vapes)   Benjamin Johnson   SDMV:06/03/24 @1025a /KATY                                           12-03-1960              LDCT: 06/18/24 @0800a /MHP    63 y.o.   Phone: [phone]  Lung Screening Narrative (confirm age 46-77 yrs Medicare / 50-80 yrs Private pay insurance)   Insurance information:[employer]   Referring Provider:Beck   This screening involves an initial phone call with a team member from our program. It is called a shared decision making visit. The initial meeting is required by insurance and Medicare to make sure you understand the program. This appointment takes about 15-20 minutes to complete. The CT scan will completed at a separate date/time. This scan takes about 5-10 minutes to complete and you may eat and drink before and after the scan.  Criteria questions for Lung Cancer Screening:   Are you a current or former smoker? Current Age began smoking: 15y   If you are a former smoker, what year did you quit smoking? Quit once for 1 yr   To calculate your smoking history, I need an accurate estimate of how many packs of cigarettes you smoked per day and for how many years. (Not just the number of PPD you are now smoking)   Years smoking 47 x Packs per day 1 = Pack years 47   (at least 20 pack yrs)   (Make sure they understand that we need to know how much they have smoked in the past, not just the number of PPD they are smoking now)  Do you have a personal history of cancer?  No    Do you have a family history of cancer? Yes  (cancer type and and relative) father/unsure type  Are you coughing up blood?  No  Have you had unexplained weight loss of 15 lbs or more in the last 6 months? No  It looks like you meet all criteria.     Additional information: N/A

## 2024-06-01 ENCOUNTER — Ambulatory Visit: Payer: Self-pay

## 2024-06-01 NOTE — Telephone Encounter (Signed)
 Appt scheduled

## 2024-06-01 NOTE — Telephone Encounter (Signed)
 FYI Only or Action Required?: FYI only for provider: appointment scheduled on 06/06/24.  Patient was last seen in primary care on 05/14/2024 by Almarie Waddell NOVAK, NP.  Called Nurse Triage reporting Dysuria.  Symptoms began several days ago.  Interventions attempted: Nothing.  Symptoms are: unchanged.  Triage Disposition: See PCP Within 2 Weeks  Patient/caregiver understands and will follow disposition?: Yes   Copied from CRM #[id number]. Topic: Clinical - Red Word Triage >> Jun 01, 2024  3:30 PM Mercedes MATSU wrote: Red Word that prompted transfer to Nurse Triage: Patient called in with UTI symptoms and states that he was hoping to see taylor beck. Reason for Disposition  Has to get out of bed to urinate > 2 times a night (i.e., nocturia)  Answer Assessment - Initial Assessment Questions Additional info: Evaluated on 05/14/24 for increased urinary frequency. Calling today to schedule follow up appointment for new bladder pressure. Scheduled next available with pcp on 06/06/24   1. SYMPTOM: What's the main symptom you're concerned about? (e.g., frequency, incontinence)     Bladder pressure, frequency 2. ONSET: When did the  bladder pressure  start?     Few days ago  3. PAIN: Is there any pain? If Yes, ask: How bad is it? (Scale: 1-10; mild, moderate, severe)     Pressure  4. CAUSE: What do you think is causing the symptoms?     Unsure, maybe UTI 5. OTHER SYMPTOMS: Do you have any other symptoms? (e.g., blood in urine, fever, flank pain, pain with urination)     Denies dysuria, urgency, fever, abdominal/back/flank pain. Has increased frequency X 1 year.  Protocols used: Urinary Symptoms-A-AH

## 2024-06-06 ENCOUNTER — Ambulatory Visit: Admitting: Family Medicine

## 2024-06-06 ENCOUNTER — Encounter: Admitting: Adult Health

## 2024-06-06 ENCOUNTER — Encounter: Payer: Self-pay | Admitting: Family Medicine

## 2024-06-06 VITALS — BP 124/87 | HR 78 | Temp 97.5°F | Ht 65.0 in | Wt 124.0 lb

## 2024-06-06 DIAGNOSIS — R39198 Other difficulties with micturition: Secondary | ICD-10-CM

## 2024-06-06 DIAGNOSIS — R3989 Other symptoms and signs involving the genitourinary system: Secondary | ICD-10-CM

## 2024-06-06 LAB — CBC WITH DIFFERENTIAL/PLATELET
Basophils Absolute: 0.1 K/uL (ref 0.0–0.1)
Basophils Relative: 1.8 % (ref 0.0–3.0)
Eosinophils Absolute: 0.1 K/uL (ref 0.0–0.7)
Eosinophils Relative: 1.9 % (ref 0.0–5.0)
HCT: 47.6 % (ref 39.0–52.0)
Hemoglobin: 16.1 g/dL (ref 13.0–17.0)
Lymphocytes Relative: 46.2 % — ABNORMAL HIGH (ref 12.0–46.0)
Lymphs Abs: 2.1 K/uL (ref 0.7–4.0)
MCHC: 33.8 g/dL (ref 30.0–36.0)
MCV: 93.2 fl (ref 78.0–100.0)
Monocytes Absolute: 0.3 K/uL (ref 0.1–1.0)
Monocytes Relative: 7.3 % (ref 3.0–12.0)
Neutro Abs: 1.9 K/uL (ref 1.4–7.7)
Neutrophils Relative %: 42.8 % — ABNORMAL LOW (ref 43.0–77.0)
Platelets: 250 K/uL (ref 150.0–400.0)
RBC: 5.11 Mil/uL (ref 4.22–5.81)
RDW: 13.6 % (ref 11.5–15.5)
WBC: 4.5 K/uL (ref 4.0–10.5)

## 2024-06-06 LAB — COMPREHENSIVE METABOLIC PANEL WITH GFR
ALT: 30 U/L (ref 0–53)
AST: 29 U/L (ref 0–37)
Albumin: 4.4 g/dL (ref 3.5–5.2)
Alkaline Phosphatase: 94 U/L (ref 39–117)
BUN: 9 mg/dL (ref 6–23)
CO2: 29 meq/L (ref 19–32)
Calcium: 9.8 mg/dL (ref 8.4–10.5)
Chloride: 100 meq/L (ref 96–112)
Creatinine, Ser: 1.04 mg/dL (ref 0.40–1.50)
GFR: 75.55 mL/min (ref >60.00)
Glucose, Bld: 91 mg/dL (ref 70–99)
Potassium: 4.6 meq/L (ref 3.5–5.1)
Sodium: 135 meq/L (ref 135–145)
Total Bilirubin: 0.5 mg/dL (ref 0.2–1.2)
Total Protein: 7.5 g/dL (ref 6.0–8.3)

## 2024-06-06 LAB — POC URINALSYSI DIPSTICK (AUTOMATED)
Bilirubin, UA: NEGATIVE
Blood, UA: NEGATIVE
Glucose, UA: NEGATIVE
Ketones, UA: NEGATIVE
Leukocytes, UA: NEGATIVE
Nitrite, UA: NEGATIVE
Protein, UA: NEGATIVE
Spec Grav, UA: <=1.005 — AB (ref 1.010–1.025)
Urobilinogen, UA: 0.2 U/dL
pH, UA: 6 (ref 5.0–8.0)

## 2024-06-06 LAB — PSA: PSA: 1.03 ng/mL (ref 0.10–4.00)

## 2024-06-06 MED ORDER — TAMSULOSIN HCL 0.4 MG PO CAPS: 0.4000 mg | ORAL_CAPSULE | Freq: Every day | ORAL | 3 refills | Status: AC

## 2024-06-06 NOTE — Progress Notes (Signed)
 Acute Office Visit  Subjective:  Patient ID: Benjamin Johnson, male    DOB: 03/05/1961  Age: 63 y.o. MRN: [id number]  CC:  Chief Complaint  Patient presents with   Abdominal Pain    Pelvic pressure      HPI [name] is here for Bladder Pressure.   Discussed the use of AI scribe software for clinical note transcription with the patient, who gave verbal consent to proceed.  History of Present Illness [name] is a 63 year old male with a history of kidney stones who presents with lower abdominal and flank pain.  He has been experiencing dull, achy pain in the right and left lower quadrants of the abdomen and bilateral flank for the past two weeks. The pain is intermittent, rated as 4 out of 10 in intensity, and is accompanied by discomfort in the groin area and difficulty maintaining urinary stream.   He has a history of kidney stones and notes that the current pain is reminiscent of previous episodes, although less severe. In the past, he experienced severe pain from kidney stones that required hospitalization. Currently, the pain is rated as 3 or 4 out of 10, described as uncomfortable but not excruciating.  He denies recent injury, sexual activity, penile discharge, burning or stinging during urination, blood in the urine, odor, or burning sensation, scrotal changes or inflammation. However, he reports increased urinary hesitancy and incomplete bladder emptying, needing to return to the bathroom shortly after urinating. No swelling, lumps, or rashes in the genital area. No fever, chills, vomiting, diarrhea, or nausea.  His caffeine intake includes two cups of coffee and occasional soda, and he has been drinking bottled water and cranberry juice.     Past Medical History:  Diagnosis Date   Allergy    Arthritis    History of gallstones 07/20/1979    Past Surgical History:  Procedure Laterality Date   ERCP  07/20/1979   KNEE SURGERY Left 2022   LUMBAR DISC  SURGERY  07/20/1987    Family History  Problem Relation Age of Onset   Cancer Father 25       colon   Hyperlipidemia Neg Hx    Hypertension Neg Hx    Diabetes Neg Hx     Social History   Socioeconomic History   Marital status: Married    Spouse name: Not on file   Number of children: Not on file   Years of education: Not on file   Highest education level: Not on file  Occupational History   Not on file  Tobacco Use   Smoking status: Every Day    Current packs/day: 0.30    Average packs/day: 0.3 packs/day for 36.0 years (10.8 ttl pk-yrs)    Types: Cigarettes   Smokeless tobacco: Never   Tobacco comments:    Reports he has cut back to 3-4 per day with use of nicotine  patches   Substance and Sexual Activity   Alcohol use: Yes    Alcohol/week: 6.0 standard drinks of alcohol    Types: 6 Cans of beer per week   Drug use: No   Sexual activity: Yes  Other Topics Concern   Not on file  Social History Narrative   Married since age 58   Twin girls (age 12) one is an financial controller.  Grandson lives with them (age 76). One daughter in [place]   Son (age 49)  He has one son   Enjoys fishing, english as a second language teacher   Works as  auto parts store part time    Completed 12th grade      Social Drivers of Health   Financial Resource Strain: Not on file  Food Insecurity: Not on file  Transportation Needs: Not on file  Physical Activity: Inactive (09/10/2019)   Received from [facility] visits prior to 09/18/2022.   Exercise Vital Sign    On average, how many days per week do you engage in moderate to strenuous exercise (like a brisk walk)?: 0 days    On average, how many minutes do you engage in exercise at this level?: 0 min  Stress: Not on file  Social Connections: Not on file  Intimate Partner Violence: Not on file    ROS All ROS negative except what is listed in the HPI.   Objective:   Today's Vitals: BP 124/87   Pulse 78   Temp (!) 97.5 F (36.4 C) (Oral)   Ht 5'  5 (1.651 m)   Wt 124 lb (56.2 kg)   SpO2 100%   BMI 20.63 kg/m   Physical Exam Vitals reviewed.  Constitutional:      General: He is not in acute distress.    Appearance: He is well-developed. He is not ill-appearing.  Abdominal:     Palpations: Abdomen is soft.     Comments: Bilateral flank to lower abdomen tenderness   Genitourinary:    Comments: deferred Skin:    General: Skin is warm and dry.  Neurological:     Mental Status: He is alert and oriented to person, place, and time.  Psychiatric:        Mood and Affect: Mood normal.        Behavior: Behavior normal.        Assessment & Plan:   Problem List Items Addressed This Visit   None Visit Diagnoses       Sensation of pressure in bladder area    -  Primary   Relevant Orders   POCT Urinalysis Dipstick (Automated) (Completed)   Urine Culture   Comprehensive metabolic panel with GFR   PSA   CBC w/Diff     Difficulty initiating urinary stream       Relevant Medications   tamsulosin (FLOMAX) 0.4 MG CAPS capsule   Other Relevant Orders   POCT Urinalysis Dipstick (Automated) (Completed)   Urine Culture   Comprehensive metabolic panel with GFR   PSA   CBC w/Diff       Assessment & Plan Flank and lower abdominal pain with urinary frequency and hesitancy, possible nephrolithiasis Intermittent flank and lower abdominal pain with urinary frequency and hesitancy. History of nephrolithiasis.  - Ordered urinalysis for hematuria and infection. Negative dip today. Will send for culture.  - Will recheck prostate and CBC to rule out prostatitis.  - Will add Flomax trial to help with stream. - If workup unremarkable and symptoms persist, refer to urology.   Potential interaction of Flomax and Wellbutrin. States he's only taken the Wellbutrin for a few days and will pause it while we get this urinary issue figured out.     Follow-up: Return for - pending results or sooner if needed.   Waddell FURY Almarie, DNP,  FNP-C  I,Emily Lagle,acting as a neurosurgeon for Waddell KATHEE Almarie, NP.,have documented all relevant documentation on the behalf of Waddell KATHEE Almarie, NP.   I, Waddell KATHEE Almarie, NP, have reviewed all documentation for this visit. The documentation on 06/06/2024 for the exam, diagnosis, procedures, and orders  are all accurate and complete.

## 2024-06-07 ENCOUNTER — Ambulatory Visit: Payer: Self-pay | Admitting: Family Medicine

## 2024-06-07 LAB — URINE CULTURE
MICRO NUMBER:: 17256473
Result:: NO GROWTH
SPECIMEN QUALITY:: ADEQUATE

## 2024-06-08 NOTE — Telephone Encounter (Signed)
 Copied from CRM [id number]. Topic: Clinical - Lab/Test Results >> Jun 08, 2024 11:14 AM Anairis L wrote: Reason for CRM: Patient calling back regarding msg, read doctors note to him.He said he is feeling better.No urology referral at the moment.   Thank you.

## 2024-06-11 ENCOUNTER — Other Ambulatory Visit: Payer: Self-pay | Admitting: Family Medicine

## 2024-06-11 DIAGNOSIS — M545 Low back pain, unspecified: Secondary | ICD-10-CM

## 2024-06-11 NOTE — Telephone Encounter (Signed)
 Please review

## 2024-06-13 ENCOUNTER — Encounter: Payer: Self-pay | Admitting: Emergency Medicine

## 2024-06-18 ENCOUNTER — Ambulatory Visit (HOSPITAL_BASED_OUTPATIENT_CLINIC_OR_DEPARTMENT_OTHER)

## 2024-06-21 ENCOUNTER — Other Ambulatory Visit: Payer: Self-pay | Admitting: Family Medicine

## 2024-06-21 DIAGNOSIS — M545 Low back pain, unspecified: Secondary | ICD-10-CM

## 2024-06-21 DIAGNOSIS — Z720 Tobacco use: Secondary | ICD-10-CM

## 2024-07-02 ENCOUNTER — Ambulatory Visit (HOSPITAL_BASED_OUTPATIENT_CLINIC_OR_DEPARTMENT_OTHER): Admission: RE | Admit: 2024-07-02 | Discharge: 2024-07-02 | Attending: Acute Care | Admitting: Acute Care

## 2024-07-02 DIAGNOSIS — Z122 Encounter for screening for malignant neoplasm of respiratory organs: Secondary | ICD-10-CM

## 2024-07-02 DIAGNOSIS — F17210 Nicotine dependence, cigarettes, uncomplicated: Secondary | ICD-10-CM | POA: Diagnosis present

## 2024-07-02 DIAGNOSIS — Z87891 Personal history of nicotine dependence: Secondary | ICD-10-CM | POA: Insufficient documentation

## 2024-07-04 ENCOUNTER — Encounter: Payer: Self-pay | Admitting: Emergency Medicine

## 2024-07-05 ENCOUNTER — Other Ambulatory Visit: Payer: Self-pay

## 2024-07-05 DIAGNOSIS — F17210 Nicotine dependence, cigarettes, uncomplicated: Secondary | ICD-10-CM

## 2024-07-05 DIAGNOSIS — Z87891 Personal history of nicotine dependence: Secondary | ICD-10-CM

## 2024-07-05 DIAGNOSIS — Z122 Encounter for screening for malignant neoplasm of respiratory organs: Secondary | ICD-10-CM

## 2024-08-20 ENCOUNTER — Encounter: Admitting: Family Medicine

## 2024-10-15 ENCOUNTER — Encounter: Admitting: Family Medicine
